# Patient Record
Sex: Female | Born: 1940 | Race: Black or African American | Hispanic: No | Smoking: Former smoker
Health system: Southern US, Community
[De-identification: ages and names within clinical notes are randomized; demographics above are authoritative.]

## PROBLEM LIST (undated history)

## (undated) ENCOUNTER — Ambulatory Visit (HOSPITAL_COMMUNITY): Discharge status: Absent without leave

## (undated) DIAGNOSIS — M5430 Sciatica, unspecified side: Secondary | ICD-10-CM

## (undated) DIAGNOSIS — F419 Anxiety disorder, unspecified: Secondary | ICD-10-CM

## (undated) DIAGNOSIS — I10 Essential (primary) hypertension: Secondary | ICD-10-CM

## (undated) DIAGNOSIS — K219 Gastro-esophageal reflux disease without esophagitis: Secondary | ICD-10-CM

## (undated) HISTORY — PX: OTHER SURGICAL HISTORY: SHX169

## (undated) HISTORY — PX: TUBAL LIGATION: SHX77

---

## 1998-08-19 ENCOUNTER — Emergency Department (HOSPITAL_COMMUNITY): Admission: EM | Admit: 1998-08-19 | Discharge: 1998-08-19 | Payer: Self-pay | Admitting: Internal Medicine

## 1999-03-25 ENCOUNTER — Encounter: Payer: Self-pay | Admitting: Emergency Medicine

## 1999-03-25 ENCOUNTER — Encounter: Payer: Self-pay | Admitting: Internal Medicine

## 1999-03-25 ENCOUNTER — Inpatient Hospital Stay (HOSPITAL_COMMUNITY): Admission: EM | Admit: 1999-03-25 | Discharge: 1999-03-28 | Payer: Self-pay | Admitting: Emergency Medicine

## 1999-03-26 ENCOUNTER — Encounter: Payer: Self-pay | Admitting: Internal Medicine

## 1999-03-27 ENCOUNTER — Encounter: Payer: Self-pay | Admitting: Internal Medicine

## 1999-05-08 ENCOUNTER — Ambulatory Visit (HOSPITAL_COMMUNITY): Admission: RE | Admit: 1999-05-08 | Discharge: 1999-05-08 | Payer: Self-pay | Admitting: Gastroenterology

## 1999-06-03 ENCOUNTER — Other Ambulatory Visit: Admission: RE | Admit: 1999-06-03 | Discharge: 1999-06-03 | Payer: Self-pay | Admitting: Internal Medicine

## 1999-08-05 ENCOUNTER — Encounter: Payer: Self-pay | Admitting: Internal Medicine

## 1999-08-05 ENCOUNTER — Encounter: Admission: RE | Admit: 1999-08-05 | Discharge: 1999-08-05 | Payer: Self-pay | Admitting: Internal Medicine

## 2000-11-24 ENCOUNTER — Other Ambulatory Visit: Admission: RE | Admit: 2000-11-24 | Discharge: 2000-11-24 | Payer: Self-pay | Admitting: Obstetrics and Gynecology

## 2001-01-18 ENCOUNTER — Encounter: Admission: RE | Admit: 2001-01-18 | Discharge: 2001-01-18 | Payer: Self-pay | Admitting: Cardiology

## 2001-01-18 ENCOUNTER — Encounter: Payer: Self-pay | Admitting: Cardiology

## 2001-06-01 ENCOUNTER — Encounter: Admission: RE | Admit: 2001-06-01 | Discharge: 2001-06-01 | Payer: Self-pay | Admitting: Cardiology

## 2001-06-01 ENCOUNTER — Encounter: Payer: Self-pay | Admitting: Cardiology

## 2001-09-22 ENCOUNTER — Ambulatory Visit (HOSPITAL_COMMUNITY): Admission: RE | Admit: 2001-09-22 | Discharge: 2001-09-22 | Payer: Self-pay | Admitting: Cardiology

## 2001-09-22 ENCOUNTER — Encounter: Payer: Self-pay | Admitting: Cardiology

## 2002-10-31 ENCOUNTER — Encounter: Payer: Self-pay | Admitting: Obstetrics

## 2002-10-31 ENCOUNTER — Ambulatory Visit (HOSPITAL_COMMUNITY): Admission: RE | Admit: 2002-10-31 | Discharge: 2002-10-31 | Payer: Self-pay | Admitting: Obstetrics

## 2003-03-04 ENCOUNTER — Encounter: Admission: RE | Admit: 2003-03-04 | Discharge: 2003-03-04 | Payer: Self-pay | Admitting: Internal Medicine

## 2003-04-08 ENCOUNTER — Emergency Department (HOSPITAL_COMMUNITY): Admission: EM | Admit: 2003-04-08 | Discharge: 2003-04-08 | Payer: Self-pay | Admitting: Emergency Medicine

## 2003-06-15 ENCOUNTER — Emergency Department (HOSPITAL_COMMUNITY): Admission: EM | Admit: 2003-06-15 | Discharge: 2003-06-15 | Payer: Self-pay

## 2003-09-04 ENCOUNTER — Ambulatory Visit (HOSPITAL_COMMUNITY): Admission: RE | Admit: 2003-09-04 | Discharge: 2003-09-04 | Payer: Self-pay | Admitting: Cardiology

## 2003-09-11 ENCOUNTER — Emergency Department (HOSPITAL_COMMUNITY): Admission: EM | Admit: 2003-09-11 | Discharge: 2003-09-12 | Payer: Self-pay | Admitting: *Deleted

## 2003-12-08 ENCOUNTER — Emergency Department (HOSPITAL_COMMUNITY): Admission: EM | Admit: 2003-12-08 | Discharge: 2003-12-08 | Payer: Self-pay | Admitting: Emergency Medicine

## 2004-05-21 ENCOUNTER — Ambulatory Visit (HOSPITAL_COMMUNITY): Admission: RE | Admit: 2004-05-21 | Discharge: 2004-05-21 | Payer: Self-pay | Admitting: *Deleted

## 2004-05-21 ENCOUNTER — Encounter (INDEPENDENT_AMBULATORY_CARE_PROVIDER_SITE_OTHER): Payer: Self-pay | Admitting: Specialist

## 2004-06-04 ENCOUNTER — Ambulatory Visit (HOSPITAL_COMMUNITY): Admission: RE | Admit: 2004-06-04 | Discharge: 2004-06-04 | Payer: Self-pay | Admitting: Obstetrics

## 2004-07-30 ENCOUNTER — Emergency Department (HOSPITAL_COMMUNITY): Admission: EM | Admit: 2004-07-30 | Discharge: 2004-07-30 | Payer: Self-pay | Admitting: Emergency Medicine

## 2004-11-27 ENCOUNTER — Encounter: Admission: RE | Admit: 2004-11-27 | Discharge: 2004-11-27 | Payer: Self-pay | Admitting: Cardiology

## 2005-05-25 ENCOUNTER — Emergency Department (HOSPITAL_COMMUNITY): Admission: EM | Admit: 2005-05-25 | Discharge: 2005-05-25 | Payer: Self-pay | Admitting: Emergency Medicine

## 2005-07-13 ENCOUNTER — Emergency Department (HOSPITAL_COMMUNITY): Admission: AD | Admit: 2005-07-13 | Discharge: 2005-07-13 | Payer: Self-pay | Admitting: Family Medicine

## 2005-07-15 ENCOUNTER — Emergency Department (HOSPITAL_COMMUNITY): Admission: EM | Admit: 2005-07-15 | Discharge: 2005-07-16 | Payer: Self-pay | Admitting: Emergency Medicine

## 2005-12-31 ENCOUNTER — Emergency Department (HOSPITAL_COMMUNITY): Admission: EM | Admit: 2005-12-31 | Discharge: 2005-12-31 | Payer: Self-pay | Admitting: Family Medicine

## 2006-04-12 ENCOUNTER — Emergency Department (HOSPITAL_COMMUNITY): Admission: EM | Admit: 2006-04-12 | Discharge: 2006-04-12 | Payer: Self-pay | Admitting: Family Medicine

## 2006-04-14 ENCOUNTER — Emergency Department (HOSPITAL_COMMUNITY): Admission: EM | Admit: 2006-04-14 | Discharge: 2006-04-14 | Payer: Self-pay | Admitting: Family Medicine

## 2006-06-24 ENCOUNTER — Emergency Department (HOSPITAL_COMMUNITY): Admission: EM | Admit: 2006-06-24 | Discharge: 2006-06-24 | Payer: Self-pay | Admitting: Emergency Medicine

## 2006-07-01 ENCOUNTER — Emergency Department (HOSPITAL_COMMUNITY): Admission: EM | Admit: 2006-07-01 | Discharge: 2006-07-01 | Payer: Self-pay | Admitting: Family Medicine

## 2006-08-20 ENCOUNTER — Encounter: Admission: RE | Admit: 2006-08-20 | Discharge: 2006-08-20 | Payer: Self-pay | Admitting: Obstetrics

## 2007-01-13 ENCOUNTER — Ambulatory Visit (HOSPITAL_BASED_OUTPATIENT_CLINIC_OR_DEPARTMENT_OTHER): Admission: RE | Admit: 2007-01-13 | Discharge: 2007-01-13 | Payer: Self-pay | Admitting: Orthopedic Surgery

## 2008-06-15 ENCOUNTER — Emergency Department (HOSPITAL_COMMUNITY): Admission: EM | Admit: 2008-06-15 | Discharge: 2008-06-15 | Payer: Self-pay | Admitting: Family Medicine

## 2008-12-27 ENCOUNTER — Emergency Department (HOSPITAL_COMMUNITY): Admission: EM | Admit: 2008-12-27 | Discharge: 2008-12-27 | Payer: Self-pay | Admitting: Family Medicine

## 2010-03-21 ENCOUNTER — Encounter
Admission: RE | Admit: 2010-03-21 | Discharge: 2010-03-21 | Payer: Self-pay | Source: Home / Self Care | Admitting: Cardiology

## 2010-05-05 ENCOUNTER — Encounter: Payer: Self-pay | Admitting: Internal Medicine

## 2010-05-05 ENCOUNTER — Encounter: Payer: Self-pay | Admitting: Obstetrics

## 2010-05-06 ENCOUNTER — Encounter: Payer: Self-pay | Admitting: Obstetrics

## 2010-08-27 NOTE — Op Note (Signed)
Darlene Curtis, Darlene Curtis           ACCOUNT NO.:  [id number]   MEDICAL RECORD NO.:  [id number]          PATIENT TYPE:  AMB   LOCATION:  DSC                          FACILITY:  [facility]   PHYSICIAN:  Mila Homer. Sherlean Foot, M.D. DATE OF BIRTH:  1940-05-06   DATE OF PROCEDURE:  01/13/2007  DATE OF DISCHARGE:                               OPERATIVE REPORT   SURGEON:  Mila Homer. Sherlean Foot, M.D.   ASSISTANT:  Richardean Canal, P.A.   ANESTHESIA:  General.   PREOPERATIVE DIAGNOSIS:  Right shoulder impingement syndrome with  possible rotator cuff tear.   POSTOPERATIVE DIAGNOSIS:  Right shoulder impingement syndrome with  possible rotator cuff tear.   PROCEDURES:  Right shoulder arthroscopy, glenohumeral debridement,  subacromial decompression, distal clavicle resection.   INDICATIONS FOR PROCEDURE:  The patient is a 70 year old black female  with failure of conservative measures for shoulder impingement syndrome,  severe tendinosis, possible small full-thickness tear.  Informed consent  obtained.   DESCRIPTION OF PROCEDURE:  The patient was laid supine and administered  general anesthesia.  The right shoulder was prepped and draped in the  usual sterile fashion.  Anterior and posterior direct lateral portals  were created with a #11 blade, blunt trocar and cannula.  Diagnostic  arthroscopy revealed some small areas of glenohumeral arthritis.  This  was debrided through the anterior portal a 3.2 Cuda shaver.  There was  some since degenerative tearing of labrum, which was debrided.  The  undersurface of the rotator cuff had wear and tear but no full-thickness  tear.  I then went into the subacromial space from the posterior portal  with the camera.  From the direct lateral portal I performed a CA  ligament release with the ArthroCare debridement wand.  I used the  shaver to clean out the bursa and used the ArthroCare debridement to  clean off the undersurface of the acromion and distal clavicle.  I  then  used a 4.0-mm cylindrical bur to perform an aggressive acromioplasty and  distal clavicle resection.  I then debrided the rotator cuff.  It looked  like there was some wear and tear directly beneath the anterolateral  corner but no full-thickness tears and certainly no avulsion tears.  I  then irrigated and closed for 4-0 nylon sutures, dressed with Xeroform  dressing sponges, ABDs, 2-inch silk tape and a simple sling.   COMPLICATIONS:  None.   DRAINS:  None.          ______________________________  Mila Homer. Sherlean Foot, M.D.    SDL/MEDQ  D:  01/13/2007  T:  01/14/2007  Job:  [id number]

## 2010-08-30 NOTE — Op Note (Signed)
NAMEMarland Curtis  KATTI, PELLE NO.:  [id number]   MEDICAL RECORD NO.:  [id number]          PATIENT TYPE:  AMB   LOCATION:  ENDO                         FACILITY:  [facility]   PHYSICIAN:  Georgiana Spinner, M.D.    DATE OF BIRTH:  1940-07-14   DATE OF PROCEDURE:  05/21/2004  DATE OF DISCHARGE:                                 OPERATIVE REPORT   PROCEDURE:  Upper endoscopy.   INDICATIONS FOR PROCEDURE:  Abdominal pain.   ANESTHESIA:  Demerol 60, Versed 8 mg.   DESCRIPTION OF PROCEDURE:  With the patient mildly sedated in the left  lateral decubitus position, the Olympus videoscopic endoscope was inserted  in the mouth and passed under direct vision through the esophagus which  appeared normal.  There was a hiatal hernia and a question of one short area  of Barrett's which was photographed and biopsied.  Entered into the stomach.  Fundus, body, antrum, duodenal bulb, and second portion of duodenum were  visualized.  From this point, the endoscope was slowly withdrawn taking  circumferential views of the duodenal mucosa.  The endoscope then pulled  back into the stomach, placed in retroflexion to view the stomach from  below.  The endoscope was then straightened and withdrawn taking  circumferential views of the gastric and esophageal mucosa.  The patient's  vital signs and pulse oximetry remained stable.  The patient tolerated the  procedure well without apparent complications.   FINDINGS:  1.  Question of Barrett's esophagus as above.  2.  Hiatal hernia, await biopsy report.   The patient will __________ follow up with me as an outpatient.      GMO/MEDQ  D:  05/21/2004  T:  05/21/2004  Job:  [id number]   cc:   Fleet Contras, M.D.  [address]  [place]  [place] [address]  Fax: [phone]

## 2010-08-30 NOTE — Procedures (Signed)
[place]  Patient:    Darlene Curtis                   MRN: [id number] Proc. Date: 05/08/99 Adm. Date:  [id number] Attending:  Charna Elizabeth CC:         Merlene Laughter. Renae Gloss, M.D.                           Procedure Report  DATE OF BIRTH:  1941-01-12  REFERRING PHYSICIAN:  Merlene Laughter. Renae Gloss, M.D.  PROCEDURE PERFORMED:  Colonoscopy.  ENDOSCOPIST:  Anselmo Rod, M.D.  INSTRUMENT USED:  Olympus video colonoscope.  INDICATIONS:  A 70 year old black female with guaiac positive stools.  Rule out  polyps, AVMs, masses, hemorrhoids, etc.  PREPROCEDURE PREPARATION:  Informed consent was procured from the patient.  The  patient was fasted for 8 hours prior to the procedure and prepped with a bottle of magnesium citrate and a gallon NuLytely the night prior to the procedure.  PREPROCEDURE PHYSICAL:  Patient has stable vital signs.  NECK:  Supple.  CHEST:  Clear to auscultation. S1, S2 regular.  ABDOMEN:  Soft with normal abdominal bowel sounds.  DESCRIPTION OF PROCEDURE:  The patient was placed in left lateral decubitus position and sedated with 75 mg Demerol and 7 mg of Versed intravenously.  Once the patient was adequately sedated and maintained on low-flow oxygen and continuous  cardiac monitoring, the Olympus video colonoscope was advanced from the rectum o the cecum without difficulty.  Except for small external hemorrhoid, no other abnormalities were seen.  No erosions, ulcerations, masses or diverticular disease was present.  The patient tolerated the procedure well without complication.  IMPRESSION:  Normal colonoscopy up to the cecum, except for a small external hemorrhoid.  RECOMMENDATIONS:  Patient has been advised to follow up in the office to repeat  guaiac testing.  Further recommendations will be made thereafter. DD:  05/08/99 TD:  05/08/99 Job: [id number]

## 2010-12-26 ENCOUNTER — Inpatient Hospital Stay (INDEPENDENT_AMBULATORY_CARE_PROVIDER_SITE_OTHER)
Admission: RE | Admit: 2010-12-26 | Discharge: 2010-12-26 | Disposition: A | Discharge status: Home / Self Care | Payer: Self-pay | Source: Ambulatory Visit | Attending: Family Medicine | Admitting: Family Medicine

## 2010-12-26 DIAGNOSIS — J019 Acute sinusitis, unspecified: Secondary | ICD-10-CM

## 2011-01-23 LAB — POCT HEMOGLOBIN-HEMACUE: Hemoglobin: 13.1

## 2011-06-11 ENCOUNTER — Emergency Department (INDEPENDENT_AMBULATORY_CARE_PROVIDER_SITE_OTHER)
Admission: EM | Admit: 2011-06-11 | Discharge: 2011-06-11 | Disposition: A | Discharge status: Home / Self Care | Payer: Medicare Other | Source: Home / Self Care | Attending: Emergency Medicine | Admitting: Emergency Medicine

## 2011-06-11 ENCOUNTER — Encounter (HOSPITAL_COMMUNITY): Payer: Self-pay | Admitting: *Deleted

## 2011-06-11 DIAGNOSIS — J309 Allergic rhinitis, unspecified: Secondary | ICD-10-CM

## 2011-06-11 DIAGNOSIS — R51 Headache: Secondary | ICD-10-CM

## 2011-06-11 MED ORDER — AZITHROMYCIN 250 MG PO TABS: ORAL_TABLET | ORAL | Status: AC

## 2011-06-11 MED ORDER — FEXOFENADINE HCL 180 MG PO TABS: 180.0000 mg | ORAL_TABLET | Freq: Every day | ORAL | Status: DC

## 2011-06-11 MED ORDER — PREDNISONE 5 MG PO KIT: 1.0000 | PACK | Freq: Every day | ORAL | Status: DC

## 2011-06-11 MED ORDER — FLUTICASONE PROPIONATE 50 MCG/ACT NA SUSP: 2.0000 | Freq: Every day | NASAL | Status: DC

## 2011-06-11 NOTE — Discharge Instructions (Signed)
Allergic Rhinitis Allergic rhinitis is when the mucous membranes in the nose respond to allergens. Allergens are particles in the air that cause your body to have an allergic reaction. This causes you to release allergic antibodies. Through a chain of events, these eventually cause you to release histamine into the blood stream (hence the use of antihistamines). Although meant to be protective to the body, it is this release that causes your discomfort, such as frequent sneezing, congestion and an itchy runny nose.  CAUSES  The pollen allergens may come from grasses, trees, and weeds. This is seasonal allergic rhinitis, or "hay fever." Other allergens cause year-round allergic rhinitis (perennial allergic rhinitis) such as house dust mite allergen, pet dander and mold spores.  SYMPTOMS   Nasal stuffiness (congestion).   Runny, itchy nose with sneezing and tearing of the eyes.   There is often an itching of the mouth, eyes and ears.  It cannot be cured, but it can be controlled with medications. DIAGNOSIS  If you are unable to determine the offending allergen, skin or blood testing may find it. TREATMENT   Avoid the allergen.   Medications and allergy shots (immunotherapy) can help.   Hay fever may often be treated with antihistamines in pill or nasal spray forms. Antihistamines block the effects of histamine. There are over-the-counter medicines that may help with nasal congestion and swelling around the eyes. Check with your caregiver before taking or giving this medicine.  If the treatment above does not work, there are many new medications your caregiver can prescribe. Stronger medications may be used if initial measures are ineffective. Desensitizing injections can be used if medications and avoidance fails. Desensitization is when a patient is given ongoing shots until the body becomes less sensitive to the allergen. Make sure you follow up with your caregiver if problems continue. SEEK  MEDICAL CARE IF:   You develop fever (more than 100.5 F (38.1 C).   You develop a cough that does not stop easily (persistent).   You have shortness of breath.   You start wheezing.   Symptoms interfere with normal daily activities.  Document Released: 12/24/2000 Document Revised: 12/11/2010 Document Reviewed: 07/05/2008 [facility] Patient Information 2012 [place], [place].Headaches, Frequently Asked Questions MIGRAINE HEADACHES Q: What is migraine? What causes it? How can I treat it? A: Generally, migraine headaches begin as a dull ache. Then they develop into a constant, throbbing, and pulsating pain. You may experience pain at the temples. You may experience pain at the front or back of one or both sides of the head. The pain is usually accompanied by a combination of:  Nausea.   Vomiting.   Sensitivity to light and noise.  Some people (about 15%) experience an aura (see below) before an attack. The cause of migraine is believed to be chemical reactions in the brain. Treatment for migraine may include over-the-counter or prescription medications. It may also include self-help techniques. These include relaxation training and biofeedback.  Q: What is an aura? A: About 15% of people with migraine get an "aura". This is a sign of neurological symptoms that occur before a migraine headache. You may see wavy or jagged lines, dots, or flashing lights. You might experience tunnel vision or blind spots in one or both eyes. The aura can include visual or auditory hallucinations (something imagined). It may include disruptions in smell (such as strange odors), taste or touch. Other symptoms include:  Numbness.   A "pins and needles" sensation.   Difficulty in recalling  or speaking the correct word.  These neurological events may last as long as 60 minutes. These symptoms will fade as the headache begins. Q: What is a trigger? A: Certain physical or environmental factors can lead to or  "trigger" a migraine. These include:  Foods.   Hormonal changes.   Weather.   Stress.  It is important to remember that triggers are different for everyone. To help prevent migraine attacks, you need to figure out which triggers affect you. Keep a headache diary. This is a good way to track triggers. The diary will help you talk to your healthcare professional about your condition. Q: Does weather affect migraines? A: Bright sunshine, hot, humid conditions, and drastic changes in barometric pressure may lead to, or "trigger," a migraine attack in some people. But studies have shown that weather does not act as a trigger for everyone with migraines. Q: What is the link between migraine and hormones? A: Hormones start and regulate many of your body's functions. Hormones keep your body in balance within a constantly changing environment. The levels of hormones in your body are unbalanced at times. Examples are during menstruation, pregnancy, or menopause. That can lead to a migraine attack. In fact, about three quarters of all women with migraine report that their attacks are related to the menstrual cycle.  Q: Is there an increased risk of stroke for migraine sufferers? A: The likelihood of a migraine attack causing a stroke is very remote. That is not to say that migraine sufferers cannot have a stroke associated with their migraines. In persons under age 74, the most common associated factor for stroke is migraine headache. But over the course of a person's normal life span, the occurrence of migraine headache may actually be associated with a reduced risk of dying from cerebrovascular disease due to stroke.  Q: What are acute medications for migraine? A: Acute medications are used to treat the pain of the headache after it has started. Examples over-the-counter medications, NSAIDs, ergots, and triptans.  Q: What are the triptans? A: Triptans are the newest class of abortive medications. They are  specifically targeted to treat migraine. Triptans are vasoconstrictors. They moderate some chemical reactions in the brain. The triptans work on receptors in your brain. Triptans help to restore the balance of a neurotransmitter called serotonin. Fluctuations in levels of serotonin are thought to be a main cause of migraine.  Q: Are over-the-counter medications for migraine effective? A: Over-the-counter, or "OTC," medications may be effective in relieving mild to moderate pain and associated symptoms of migraine. But you should see your caregiver before beginning any treatment regimen for migraine.  Q: What are preventive medications for migraine? A: Preventive medications for migraine are sometimes referred to as "prophylactic" treatments. They are used to reduce the frequency, severity, and length of migraine attacks. Examples of preventive medications include antiepileptic medications, antidepressants, beta-blockers, calcium channel blockers, and NSAIDs (nonsteroidal anti-inflammatory drugs). Q: Why are anticonvulsants used to treat migraine? A: During the past few years, there has been an increased interest in antiepileptic drugs for the prevention of migraine. They are sometimes referred to as "anticonvulsants". Both epilepsy and migraine may be caused by similar reactions in the brain.  Q: Why are antidepressants used to treat migraine? A: Antidepressants are typically used to treat people with depression. They may reduce migraine frequency by regulating chemical levels, such as serotonin, in the brain.  Q: What alternative therapies are used to treat migraine? A: The term "alternative therapies" is  often used to describe treatments considered outside the scope of conventional Western medicine. Examples of alternative therapy include acupuncture, acupressure, and yoga. Another common alternative treatment is herbal therapy. Some herbs are believed to relieve headache pain. Always discuss alternative  therapies with your caregiver before proceeding. Some herbal products contain arsenic and other toxins. TENSION HEADACHES Q: What is a tension-type headache? What causes it? How can I treat it? A: Tension-type headaches occur randomly. They are often the result of temporary stress, anxiety, fatigue, or anger. Symptoms include soreness in your temples, a tightening band-like sensation around your head (a "vice-like" ache). Symptoms can also include a pulling feeling, pressure sensations, and contracting head and neck muscles. The headache begins in your forehead, temples, or the back of your head and neck. Treatment for tension-type headache may include over-the-counter or prescription medications. Treatment may also include self-help techniques such as relaxation training and biofeedback. CLUSTER HEADACHES Q: What is a cluster headache? What causes it? How can I treat it? A: Cluster headache gets its name because the attacks come in groups. The pain arrives with little, if any, warning. It is usually on one side of the head. A tearing or bloodshot eye and a runny nose on the same side of the headache may also accompany the pain. Cluster headaches are believed to be caused by chemical reactions in the brain. They have been described as the most severe and intense of any headache type. Treatment for cluster headache includes prescription medication and oxygen. SINUS HEADACHES Q: What is a sinus headache? What causes it? How can I treat it? A: When a cavity in the bones of the face and skull (a sinus) becomes inflamed, the inflammation will cause localized pain. This condition is usually the result of an allergic reaction, a tumor, or an infection. If your headache is caused by a sinus blockage, such as an infection, you will probably have a fever. An x-ray will confirm a sinus blockage. Your caregiver's treatment might include antibiotics for the infection, as well as antihistamines or decongestants.    REBOUND HEADACHES Q: What is a rebound headache? What causes it? How can I treat it? A: A pattern of taking acute headache medications too often can lead to a condition known as "rebound headache." A pattern of taking too much headache medication includes taking it more than 2 days per week or in excessive amounts. That means more than the label or a caregiver advises. With rebound headaches, your medications not only stop relieving pain, they actually begin to cause headaches. Doctors treat rebound headache by tapering the medication that is being overused. Sometimes your caregiver will gradually substitute a different type of treatment or medication. Stopping may be a challenge. Regularly overusing a medication increases the potential for serious side effects. Consult a caregiver if you regularly use headache medications more than 2 days per week or more than the label advises. ADDITIONAL QUESTIONS AND ANSWERS Q: What is biofeedback? A: Biofeedback is a self-help treatment. Biofeedback uses special equipment to monitor your body's involuntary physical responses. Biofeedback monitors:  Breathing.   Pulse.   Heart rate.   Temperature.   Muscle tension.   Brain activity.  Biofeedback helps you refine and perfect your relaxation exercises. You learn to control the physical responses that are related to stress. Once the technique has been mastered, you do not need the equipment any more. Q: Are headaches hereditary? A: Four out of five (80%) of people that suffer report a family history  of migraine. Scientists are not sure if this is genetic or a family predisposition. Despite the uncertainty, a child has a 50% chance of having migraine if one parent suffers. The child has a 75% chance if both parents suffer.  Q: Can children get headaches? A: By the time they reach high school, most young people have experienced some type of headache. Many safe and effective approaches or medications can  prevent a headache from occurring or stop it after it has begun.  Q: What type of doctor should I see to diagnose and treat my headache? A: Start with your primary caregiver. Discuss his or her experience and approach to headaches. Discuss methods of classification, diagnosis, and treatment. Your caregiver may decide to recommend you to a headache specialist, depending upon your symptoms or other physical conditions. Having diabetes, allergies, etc., may require a more comprehensive and inclusive approach to your headache. The National Head[employer] Foundation will provide, upon request, a list of [facility] physician members in your state. Document Released: 06/21/2003 Document Revised: 12/11/2010 Document Reviewed: 11/29/2007 [facility] Patient Information 2012 [place], [place].

## 2011-06-11 NOTE — ED Provider Notes (Signed)
Chief Complaint  Patient presents with  . Headache    History of Present Illness:  Darlene Curtis is a 71 year old female who has had a day history of a bifrontal pressure-like headache which she feels in the back of her eyes. She denies any associated nausea, photophobia, or phonophobia. She's had no visual symptoms including diplopia, blurred vision, auras, or spots. She does have some nasal congestion and rhinorrhea which is occasionally yellow in color. She denies any fevers or chills, she has had some sweats at nighttime. No sore throat or cough. She does have a history of sinus headache in the past. She's been trying Sudafed without relief. She denies any jaw claudication. She's had no paresthesias, numbness, tingling, muscle weakness, difficulty with speech, swallowing, or ambulation.  Review of Systems:  Other than noted above, the patient denies any of the following symptoms: Systemic:  No fever, chills, fatigue, photophobia, stiff neck. Eye:  No redness, eye pain, discharge, blurred vision, or diplopia. ENT:  No nasal congestion, rhinorrhea, sinus pressure or pain, sneezing, earache, or sore throat. Neuro:  No paresthesias, loss of consciousness, seizure activity, muscle weakness, trouble with coordination or gait, trouble speaking or swallowing. Psych:  No depression, anxiety or trouble sleeping.  PMFSH:  Past medical history, family history, social history, meds, and allergies were reviewed.  Physical Exam:   Vital signs:  BP 154/93  Pulse 93  Temp(Src) 97.8 F (36.6 C) (Oral)  Resp 18  SpO2 100% General:  Alert and oriented.  In no distress. Eye:  Lids and conjunctivas normal.  PERRL,  Full EOMs.  Fundi benign with normal discs and vessels. She has had bilateral cataract extractions. ENT:  No cranial or facial tenderness to palpation.  TMs and canals clear.  Nasal mucosa was congested without any drainage. No intra oral lesions, pharynx clear, mucous membranes moist, dentition  normal. Neck:  Supple, full ROM, no tenderness to palpation.  No adenopathy or mass. Neuro:  Alert and orented times 3.  Speech was clear, fluent, and appropriate.  Cranial nerves intact. No pronator drift, muscle strength normal. Finger to nose normal.  DTRs 2+ . Psych:  Normal affect.  Assessment:   Diagnoses that have been ruled out:  None  Diagnoses that are still under consideration:  None  Final diagnoses:  Sinus headache  Allergic rhinitis    Plan:   1.  The following meds were prescribed:   New Prescriptions   AZITHROMYCIN (ZITHROMAX Z-PAK) 250 MG TABLET    Take as directed.   FEXOFENADINE (ALLEGRA) 180 MG TABLET    Take 1 tablet (180 mg total) by mouth daily.   FLUTICASONE (FLONASE) 50 MCG/ACT NASAL SPRAY    Place 2 sprays into the nose daily.   PREDNISONE 5 MG KIT    Take 1 kit (5 mg total) by mouth daily after breakfast. Prednisone 5 mg 6 day dosepack.  Take as directed.   2.  The patient was instructed in symptomatic care and handouts were given. 3.  The patient was told to return if becoming worse in any way, if no better in 3 or 4 days, and given some red flag symptoms that would indicate earlier return.    Roque Lias, MD 06/11/11 [phone]

## 2011-06-11 NOTE — ED Notes (Signed)
pT  REPORTS  SYMPTOMS  OF  SINUS  CONGESTION   /  HEADACHES     FOR THREE  DAYS      SHE  REPORTS  THE  SYMPTOMS  UNRELEIVED  BY OTC  MEDS    SUCH AS  SUDAFED       SHE  IS  AWAKE   AND  ALERT     ORIENTED         SITTING  UP  COMFORTABLY ON  EXAM TABLE  SPEAKING IN  COMPLETE  SENTANCES

## 2012-03-17 ENCOUNTER — Other Ambulatory Visit: Payer: Self-pay | Admitting: Internal Medicine

## 2012-03-17 DIAGNOSIS — R1011 Right upper quadrant pain: Secondary | ICD-10-CM

## 2012-03-19 ENCOUNTER — Ambulatory Visit
Admission: RE | Admit: 2012-03-19 | Discharge: 2012-03-19 | Disposition: A | Discharge status: Home / Self Care | Payer: Medicare Other | Source: Ambulatory Visit | Attending: Internal Medicine | Admitting: Internal Medicine

## 2012-03-19 DIAGNOSIS — R1011 Right upper quadrant pain: Secondary | ICD-10-CM

## 2012-03-19 MED ORDER — IOHEXOL 300 MG/ML  SOLN
100.0000 mL | Freq: Once | INTRAMUSCULAR | Status: AC | PRN
  Administered 2012-03-19: 100 mL via INTRAVENOUS

## 2012-05-25 ENCOUNTER — Emergency Department (HOSPITAL_COMMUNITY)
Admission: EM | Admit: 2012-05-25 | Discharge: 2012-05-25 | Disposition: A | Discharge status: Home / Self Care | Payer: Medicare Other | Attending: Emergency Medicine | Admitting: Emergency Medicine

## 2012-05-25 ENCOUNTER — Encounter (HOSPITAL_COMMUNITY): Payer: Self-pay | Admitting: Emergency Medicine

## 2012-05-25 ENCOUNTER — Emergency Department (HOSPITAL_COMMUNITY): Payer: Medicare Other

## 2012-05-25 DIAGNOSIS — K219 Gastro-esophageal reflux disease without esophagitis: Secondary | ICD-10-CM | POA: Insufficient documentation

## 2012-05-25 DIAGNOSIS — N390 Urinary tract infection, site not specified: Secondary | ICD-10-CM | POA: Insufficient documentation

## 2012-05-25 DIAGNOSIS — Z87891 Personal history of nicotine dependence: Secondary | ICD-10-CM | POA: Insufficient documentation

## 2012-05-25 DIAGNOSIS — Z79899 Other long term (current) drug therapy: Secondary | ICD-10-CM | POA: Insufficient documentation

## 2012-05-25 DIAGNOSIS — F411 Generalized anxiety disorder: Secondary | ICD-10-CM | POA: Insufficient documentation

## 2012-05-25 DIAGNOSIS — R110 Nausea: Secondary | ICD-10-CM | POA: Insufficient documentation

## 2012-05-25 HISTORY — DX: Gastro-esophageal reflux disease without esophagitis: K21.9

## 2012-05-25 HISTORY — DX: Anxiety disorder, unspecified: F41.9

## 2012-05-25 LAB — CBC WITH DIFFERENTIAL/PLATELET
Basophils Absolute: 0 10*3/uL (ref 0.0–0.1)
Basophils Relative: 0 % (ref 0–1)
Eosinophils Absolute: 0.3 10*3/uL (ref 0.0–0.7)
Eosinophils Relative: 5 % (ref 0–5)
HCT: 41.5 % (ref 36.0–46.0)
Hemoglobin: 13.7 g/dL (ref 12.0–15.0)
Lymphocytes Relative: 35 % (ref 12–46)
Lymphs Abs: 2 10*3/uL (ref 0.7–4.0)
MCH: 28.7 pg (ref 26.0–34.0)
MCHC: 33 g/dL (ref 30.0–36.0)
MCV: 87 fL (ref 78.0–100.0)
Monocytes Absolute: 0.4 10*3/uL (ref 0.1–1.0)
Monocytes Relative: 7 % (ref 3–12)
Neutro Abs: 3.2 10*3/uL (ref 1.7–7.7)
Neutrophils Relative %: 54 % (ref 43–77)
Platelets: 271 10*3/uL (ref 150–400)
RBC: 4.77 MIL/uL (ref 3.87–5.11)
RDW: 13.5 % (ref 11.5–15.5)
WBC: 5.8 10*3/uL (ref 4.0–10.5)

## 2012-05-25 LAB — URINALYSIS, ROUTINE W REFLEX MICROSCOPIC
Bilirubin Urine: NEGATIVE
Glucose, UA: NEGATIVE mg/dL
Hgb urine dipstick: NEGATIVE
Ketones, ur: NEGATIVE mg/dL
Nitrite: POSITIVE — AB
Protein, ur: NEGATIVE mg/dL
Specific Gravity, Urine: 1.006 (ref 1.005–1.030)
Urobilinogen, UA: 0.2 mg/dL (ref 0.0–1.0)
pH: 6.5 (ref 5.0–8.0)

## 2012-05-25 LAB — COMPREHENSIVE METABOLIC PANEL
ALT: 11 U/L (ref 0–35)
AST: 16 U/L (ref 0–37)
Albumin: 3.8 g/dL (ref 3.5–5.2)
Alkaline Phosphatase: 110 U/L (ref 39–117)
BUN: 10 mg/dL (ref 6–23)
CO2: 30 mEq/L (ref 19–32)
Calcium: 9.8 mg/dL (ref 8.4–10.5)
Chloride: 103 mEq/L (ref 96–112)
Creatinine, Ser: 1.02 mg/dL (ref 0.50–1.10)
GFR calc Af Amer: 63 mL/min — ABNORMAL LOW (ref >90)
GFR calc non Af Amer: 54 mL/min — ABNORMAL LOW (ref >90)
Glucose, Bld: 88 mg/dL (ref 70–99)
Potassium: 3.6 mEq/L (ref 3.5–5.1)
Sodium: 142 mEq/L (ref 135–145)
Total Bilirubin: 0.3 mg/dL (ref 0.3–1.2)
Total Protein: 7.1 g/dL (ref 6.0–8.3)

## 2012-05-25 LAB — URINE MICROSCOPIC-ADD ON

## 2012-05-25 LAB — LIPASE, BLOOD: Lipase: 34 U/L (ref 11–59)

## 2012-05-25 LAB — LACTIC ACID, PLASMA: Lactic Acid, Venous: 0.9 mmol/L (ref 0.5–2.2)

## 2012-05-25 MED ORDER — PROMETHAZINE HCL 25 MG PO TABS: 25.0000 mg | ORAL_TABLET | Freq: Four times a day (QID) | ORAL | Status: DC | PRN

## 2012-05-25 MED ORDER — NITROFURANTOIN MONOHYD MACRO 100 MG PO CAPS: 100.0000 mg | ORAL_CAPSULE | Freq: Two times a day (BID) | ORAL | Status: DC

## 2012-05-25 NOTE — ED Provider Notes (Signed)
History     CSN: [id number]  Arrival date & time 05/25/12  0547   First MD Initiated Contact with Patient 05/25/12 [phone]      Chief Complaint  Patient presents with  . Abdominal Pain    epigastric    (Consider location/radiation/quality/duration/timing/severity/associated sxs/prior treatment) Patient is a 72 y.o. female presenting with abdominal pain.  Abdominal Pain  Patient is a 72 yo female with a history of GERD and anxiety who presents today with abdominal pain.  She started to feel nauseated since Saturday evening but it got worse on Sunday.  Also on Sunday she started to have a burning sensation in the epigastric region that is constant.  She though maybe this was related to her GERD and so she took Nexium but that didn't work.  After not having a bowel movement the day before she decided to take a laxative.  Her bowel movement didn't have any blood in it but she noticed a thick jelly like substance this morning.  Today she also threw a little bit of clear fluid.  Denies any fever, shortness of breath, chest pain, dysuria, hematuria, triggers, alleviating factors, previous burning pain like this, smoking or alcohol intake.    Past Medical History  Diagnosis Date  . Anxiety   . GERD (gastroesophageal reflux disease)     Past Surgical History  Procedure Laterality Date  . R shoulder surgery      No family history on file.  History  Substance Use Topics  . Smoking status: Former Games developer  . Smokeless tobacco: Not on file  . Alcohol Use: No    OB History   Grav Para Term Preterm Abortions TAB SAB Ect Mult Living                  Review of Systems  Gastrointestinal: Positive for abdominal pain.   All other systems negative except as documented in the HPI. All pertinent positives and negatives as reviewed in the HPI.  Allergies  Amoxicillin  Home Medications   Current Outpatient Rx  Name  Route  Sig  Dispense  Refill  . esomeprazole (NEXIUM) 40 MG capsule  Oral   Take 40 mg by mouth daily before breakfast.         . HYDROcodone-acetaminophen (NORCO/VICODIN) 5-325 MG per tablet   Oral   Take 0.5-1 tablets by mouth every 6 (six) hours as needed for pain. pain         . ALPRAZolam (XANAX) 0.5 MG tablet   Oral   Take 0.5 mg by mouth at bedtime as needed.         . fluticasone (FLONASE) 50 MCG/ACT nasal spray   Nasal   Place 2 sprays into the nose daily.   16 g   0     BP 161/94  Pulse 98  Temp(Src) 98 F (36.7 C) (Oral)  Ht 5\' 2"  (1.575 m)  Wt 150 lb (68.04 kg)  BMI 27.43 kg/m2  SpO2 98%  Physical Exam  Constitutional: She is oriented to person, place, and time. She appears well-developed and well-nourished.  HENT:  Head: Normocephalic and atraumatic.  Nose: Nose normal.  Eyes: Conjunctivae, EOM and lids are normal. Pupils are equal, round, and reactive to light.  Neck: Normal range of motion.  Cardiovascular: Normal rate, regular rhythm and normal heart sounds.   Pulmonary/Chest: Effort normal and breath sounds normal. She has no decreased breath sounds. She has no wheezes. She has no rhonchi. She has no rales.  Abdominal: Soft. Normal appearance. There is tenderness in the epigastric area. There is no rigidity, no rebound and no guarding.    Neurological: She is alert and oriented to person, place, and time. No cranial nerve deficit or sensory deficit.  Skin: No rash noted. She is not diaphoretic. No pallor.  Psychiatric: She has a normal mood and affect. Her speech is normal and behavior is normal. Judgment and thought content normal. Cognition and memory are normal.    ED Course  Procedures (including critical care time)  Labs Reviewed  COMPREHENSIVE METABOLIC PANEL  CBC WITH DIFFERENTIAL  URINALYSIS, ROUTINE W REFLEX MICROSCOPIC  LIPASE, BLOOD   The patient retreated for UTI and she has had frequency with urination after further questioning.  Patient states that she's not had any fevers.  The patient has not  had any vomiting here in the emergency department has been stable.  Patient, states that she will followup with her primary care doctor.  She is advised to return here for any worsening in her condition.   MDM  MDM Reviewed: nursing note and vitals Interpretation: labs and ultrasound            Carlyle Dolly, PA-C 05/26/12 1516

## 2012-05-25 NOTE — ED Notes (Signed)
Per lab, tube system problems earlier and never received patient urine sample.  Will resample when she returns from [place].

## 2012-05-25 NOTE — ED Notes (Signed)
Patient claims that she started having abdominal pain Sunday night.  Patient claims that she took a laxative Monday morning and felt relief.  But that the pain came back.  Patient claims that she did have one episode of vomiting.

## 2012-05-27 LAB — URINE CULTURE

## 2012-05-28 NOTE — ED Notes (Signed)
+   Urine] Patient treated with Macrobid-sensitive to same-chart appended per protocol MD. 

## 2012-05-28 NOTE — ED Provider Notes (Signed)
Medical screening examination/treatment/procedure(s) were performed by non-physician practitioner and as supervising physician I was immediately available for consultation/collaboration.  Jerrianne Hartin K Linker, MD 05/28/12 1502 

## 2012-09-13 ENCOUNTER — Other Ambulatory Visit: Payer: Self-pay

## 2012-09-13 ENCOUNTER — Other Ambulatory Visit: Payer: Self-pay | Admitting: Internal Medicine

## 2012-09-13 DIAGNOSIS — Z1231 Encounter for screening mammogram for malignant neoplasm of breast: Secondary | ICD-10-CM

## 2012-09-13 DIAGNOSIS — Z780 Asymptomatic menopausal state: Secondary | ICD-10-CM

## 2012-10-12 ENCOUNTER — Ambulatory Visit
Admission: RE | Admit: 2012-10-12 | Discharge: 2012-10-12 | Disposition: A | Discharge status: Home / Self Care | Payer: Medicare Other | Source: Ambulatory Visit

## 2012-10-12 ENCOUNTER — Ambulatory Visit
Admission: RE | Admit: 2012-10-12 | Discharge: 2012-10-12 | Disposition: A | Discharge status: Home / Self Care | Payer: Medicare Other | Source: Ambulatory Visit | Attending: Internal Medicine | Admitting: Internal Medicine

## 2012-10-12 DIAGNOSIS — Z780 Asymptomatic menopausal state: Secondary | ICD-10-CM

## 2012-10-12 DIAGNOSIS — Z1231 Encounter for screening mammogram for malignant neoplasm of breast: Secondary | ICD-10-CM

## 2012-11-12 ENCOUNTER — Inpatient Hospital Stay (HOSPITAL_COMMUNITY)
Admission: EM | Admit: 2012-11-12 | Discharge: 2012-11-17 | DRG: 392 | Disposition: A | Discharge status: Home / Self Care | Payer: Medicare Other | Attending: Surgery | Admitting: Surgery

## 2012-11-12 ENCOUNTER — Emergency Department (HOSPITAL_COMMUNITY): Payer: Medicare Other

## 2012-11-12 ENCOUNTER — Encounter (HOSPITAL_COMMUNITY): Payer: Self-pay | Admitting: Emergency Medicine

## 2012-11-12 DIAGNOSIS — K5289 Other specified noninfective gastroenteritis and colitis: Principal | ICD-10-CM | POA: Diagnosis present

## 2012-11-12 DIAGNOSIS — Z87891 Personal history of nicotine dependence: Secondary | ICD-10-CM

## 2012-11-12 DIAGNOSIS — E875 Hyperkalemia: Secondary | ICD-10-CM | POA: Diagnosis present

## 2012-11-12 DIAGNOSIS — K56600 Partial intestinal obstruction, unspecified as to cause: Secondary | ICD-10-CM | POA: Diagnosis present

## 2012-11-12 DIAGNOSIS — K219 Gastro-esophageal reflux disease without esophagitis: Secondary | ICD-10-CM | POA: Diagnosis present

## 2012-11-12 DIAGNOSIS — K565 Intestinal adhesions [bands], unspecified as to partial versus complete obstruction: Secondary | ICD-10-CM | POA: Diagnosis present

## 2012-11-12 DIAGNOSIS — K56609 Unspecified intestinal obstruction, unspecified as to partial versus complete obstruction: Secondary | ICD-10-CM

## 2012-11-12 DIAGNOSIS — F411 Generalized anxiety disorder: Secondary | ICD-10-CM | POA: Diagnosis present

## 2012-11-12 LAB — URINE MICROSCOPIC-ADD ON

## 2012-11-12 LAB — CBC WITH DIFFERENTIAL/PLATELET
Eosinophils Absolute: 0.1 10*3/uL (ref 0.0–0.7)
Eosinophils Relative: 1 % (ref 0–5)
Lymphs Abs: 1.3 10*3/uL (ref 0.7–4.0)
MCH: 29.6 pg (ref 26.0–34.0)
MCHC: 34.5 g/dL (ref 30.0–36.0)
MCV: 85.8 fL (ref 78.0–100.0)
Platelets: 277 10*3/uL (ref 150–400)
RBC: 5 MIL/uL (ref 3.87–5.11)
RDW: 13.6 % (ref 11.5–15.5)

## 2012-11-12 LAB — LIPASE, BLOOD: Lipase: 26 U/L (ref 11–59)

## 2012-11-12 LAB — URINALYSIS, ROUTINE W REFLEX MICROSCOPIC
Nitrite: NEGATIVE
Specific Gravity, Urine: 1.009 (ref 1.005–1.030)
Urobilinogen, UA: 0.2 mg/dL (ref 0.0–1.0)

## 2012-11-12 LAB — COMPREHENSIVE METABOLIC PANEL
ALT: 19 U/L (ref 0–35)
BUN: 6 mg/dL (ref 6–23)
Calcium: 9.6 mg/dL (ref 8.4–10.5)
GFR calc Af Amer: 83 mL/min — ABNORMAL LOW (ref >90)
Glucose, Bld: 97 mg/dL (ref 70–99)
Sodium: 135 mEq/L (ref 135–145)
Total Protein: 8.2 g/dL (ref 6.0–8.3)

## 2012-11-12 MED ORDER — MORPHINE SULFATE 2 MG/ML IJ SOLN
2.0000 mg | Freq: Once | INTRAMUSCULAR | Status: AC
  Administered 2012-11-12: 2 mg via INTRAVENOUS
  Filled 2012-11-12: qty 1

## 2012-11-12 MED ORDER — SODIUM CHLORIDE 0.9 % IV BOLUS (SEPSIS)
1000.0000 mL | Freq: Once | INTRAVENOUS | Status: AC
  Administered 2012-11-12: 1000 mL via INTRAVENOUS

## 2012-11-12 MED ORDER — MORPHINE SULFATE 4 MG/ML IJ SOLN
4.0000 mg | Freq: Once | INTRAMUSCULAR | Status: DC
  Filled 2012-11-12: qty 1

## 2012-11-12 MED ORDER — ONDANSETRON HCL 4 MG/2ML IJ SOLN
4.0000 mg | Freq: Once | INTRAMUSCULAR | Status: AC
  Administered 2012-11-12: 4 mg via INTRAVENOUS
  Filled 2012-11-12: qty 2

## 2012-11-12 MED ORDER — IOHEXOL 300 MG/ML  SOLN
50.0000 mL | Freq: Once | INTRAMUSCULAR | Status: AC | PRN
  Administered 2012-11-12: 50 mL via ORAL

## 2012-11-12 MED ORDER — IOHEXOL 300 MG/ML  SOLN
100.0000 mL | Freq: Once | INTRAMUSCULAR | Status: AC | PRN
  Administered 2012-11-12: 100 mL via INTRAVENOUS

## 2012-11-12 NOTE — ED Provider Notes (Signed)
CSN: [id number]     Arrival date & time 11/12/12  1723 History     First MD Initiated Contact with Patient 11/12/12 1840     Chief Complaint  Patient presents with  . Abdominal Pain   (Consider location/radiation/quality/duration/timing/severity/associated sxs/prior Treatment) Patient is a 72 y.o. female presenting with abdominal pain. The history is provided by the patient.  Abdominal Pain This is a new problem. Episode onset: this morning. The problem occurs constantly. The problem has been gradually worsening. Associated symptoms include abdominal pain. Nothing aggravates the symptoms. Nothing relieves the symptoms. She has tried nothing for the symptoms. The treatment provided no relief.    Past Medical History  Diagnosis Date  . Anxiety   . GERD (gastroesophageal reflux disease)    Past Surgical History  Procedure Laterality Date  . R shoulder surgery     History reviewed. No pertinent family history. History  Substance Use Topics  . Smoking status: Former Games developer  . Smokeless tobacco: Not on file  . Alcohol Use: No   OB History   Grav Para Term Preterm Abortions TAB SAB Ect Mult Living                 Review of Systems  Gastrointestinal: Positive for abdominal pain.  All other systems reviewed and are negative.    Allergies  Amoxicillin  Home Medications   Current Outpatient Rx  Name  Route  Sig  Dispense  Refill  . ALPRAZolam (XANAX) 0.5 MG tablet   Oral   Take 0.5 mg by mouth 3 (three) times daily as needed for sleep or anxiety.          . clindamycin (CLEOCIN) 300 MG capsule   Oral   Take 300 mg by mouth 3 (three) times daily. For 7 days; Start date 11/11/12         . esomeprazole (NEXIUM) 40 MG capsule   Oral   Take 40 mg by mouth daily as needed (acid reflux).          Marland Kitchen HYDROcodone-acetaminophen (NORCO/VICODIN) 5-325 MG per tablet   Oral   Take 0.5-1 tablets by mouth every 6 (six) hours as needed for pain.           BP 156/88  Pulse  107  Temp(Src) 98.1 F (36.7 C) (Oral)  Resp 16  SpO2 100% Physical Exam  Nursing note and vitals reviewed. Constitutional: She is oriented to person, place, and time. She appears well-developed and well-nourished. No distress.  HENT:  Head: Normocephalic and atraumatic.  Mouth/Throat: Oropharynx is clear and moist.  Neck: Normal range of motion. Neck supple.  Cardiovascular: Normal rate and regular rhythm.  Exam reveals no gallop and no friction rub.   No murmur heard. Pulmonary/Chest: Effort normal and breath sounds normal. No respiratory distress. She has no wheezes.  Abdominal: Soft. Bowel sounds are normal. She exhibits no distension and no mass. There is tenderness. There is no rebound and no guarding.  There is ttp in the epigastric region without rebound or guarding.    Musculoskeletal: Normal range of motion.  Neurological: She is alert and oriented to person, place, and time.  Skin: Skin is warm and dry. She is not diaphoretic.    ED Course   Procedures (including critical care time)  Labs Reviewed  CBC WITH DIFFERENTIAL  COMPREHENSIVE METABOLIC PANEL  LIPASE, BLOOD  URINALYSIS, ROUTINE W REFLEX MICROSCOPIC   No results found. No diagnosis found.  MDM  The ct scan shows a  partial small bowel obstruction, the etiology of which may be infectious enteritis.  She is not actively vomiting and does not appear acutely ill.  I have spoken with Dr. Luisa Hart from surgery who will see the patient in the ED.  Geoffery Lyons, MD 11/13/12 [phone]

## 2012-11-12 NOTE — ED Notes (Signed)
Last dose clindamycin 1100. Last dose vicodin 0700

## 2012-11-12 NOTE — ED Notes (Signed)
PT taken Rx clindamycin for suspected infection from tooth extraction (Monday). PT states that she has been having increasing stomach discomfort starting with and since taking clindamycin. N/v present. NO diarrhea. NAD

## 2012-11-12 NOTE — H&P (Signed)
Darlene Curtis is an 72 y.o. female.   Chief Complaint: nausea and abdominal pain HPI: asked to see patient at request of Dr. Judd Lien for abdominal pain. She has had mild to moderate diffuse abdominal pain for 3 days. Her last bowel movement was 3 days ago. She's had nausea and a small amount of vomiting. Nothing makes this better or worse. She's been on clindamycin for some dental surgery recently. Denies any blood in her stool previously. No diarrhea. CT was obtained tonight which shows partial small bowel obstruction with a thickened loop of distal jejunum. Currently, her abdominal pain has improved but she's not passing flatus.  Past Medical History  Diagnosis Date  . Anxiety   . GERD (gastroesophageal reflux disease)     Past Surgical History  Procedure Laterality Date  . R shoulder surgery      History reviewed. No pertinent family history. Social History:  reports that she has quit smoking. She does not have any smokeless tobacco history on file. She reports that she does not drink alcohol or use illicit drugs.  Allergies:  Allergies  Allergen Reactions  . Amoxicillin Other (See Comments)    Does not work for patient. "my body had become immune"     (Not in a hospital admission)  Results for orders placed during the hospital encounter of 11/12/12 (from the past 48 hour(s))  CBC WITH DIFFERENTIAL     Status: Abnormal   Collection Time    11/12/12  7:43 PM      Result Value Range   WBC 8.1  4.0 - 10.5 K/uL   RBC 5.00  3.87 - 5.11 MIL/uL   Hemoglobin 14.8  12.0 - 15.0 g/dL   HCT 30.8  65.7 - 84.6 %   MCV 85.8  78.0 - 100.0 fL   MCH 29.6  26.0 - 34.0 pg   MCHC 34.5  30.0 - 36.0 g/dL   RDW 96.2  95.2 - 84.1 %   Platelets 277  150 - 400 K/uL   Neutrophils Relative % 80 (*) 43 - 77 %   Neutro Abs 6.5  1.7 - 7.7 K/uL   Lymphocytes Relative 16  12 - 46 %   Lymphs Abs 1.3  0.7 - 4.0 K/uL   Monocytes Relative 3  3 - 12 %   Monocytes Absolute 0.3  0.1 - 1.0 K/uL    Eosinophils Relative 1  0 - 5 %   Eosinophils Absolute 0.1  0.0 - 0.7 K/uL   Basophils Relative 0  0 - 1 %   Basophils Absolute 0.0  0.0 - 0.1 K/uL  COMPREHENSIVE METABOLIC PANEL     Status: Abnormal   Collection Time    11/12/12  7:43 PM      Result Value Range   Sodium 135  135 - 145 mEq/L   Potassium 6.2 (*) 3.5 - 5.1 mEq/L   Comment: HEMOLYSIS AT THIS LEVEL MAY AFFECT RESULT   Chloride 99  96 - 112 mEq/L   CO2 27  19 - 32 mEq/L   Glucose, Bld 97  70 - 99 mg/dL   BUN 6  6 - 23 mg/dL   Creatinine, Ser 3.24  0.50 - 1.10 mg/dL   Calcium 9.6  8.4 - 40.1 mg/dL   Total Protein 8.2  6.0 - 8.3 g/dL   Albumin 4.1  3.5 - 5.2 g/dL   AST 52 (*) 0 - 37 U/L   ALT 19  0 - 35 U/L  Comment: HEMOLYSIS AT THIS LEVEL MAY AFFECT RESULT   Alkaline Phosphatase 117  39 - 117 U/L   Comment: HEMOLYSIS AT THIS LEVEL MAY AFFECT RESULT   Total Bilirubin 0.3  0.3 - 1.2 mg/dL   GFR calc non Af Amer 72 (*) >90 mL/min   GFR calc Af Amer 83 (*) >90 mL/min   Comment:            The eGFR has been calculated     using the CKD EPI equation.     This calculation has not been     validated in all clinical     situations.     eGFR's persistently     <90 mL/min signify     possible Chronic Kidney Disease.  LIPASE, BLOOD     Status: None   Collection Time    11/12/12  7:43 PM      Result Value Range   Lipase 26  11 - 59 U/L  URINALYSIS, ROUTINE W REFLEX MICROSCOPIC     Status: Abnormal   Collection Time    11/12/12  8:49 PM      Result Value Range   Color, Urine YELLOW  YELLOW   APPearance CLEAR  CLEAR   Specific Gravity, Urine 1.009  1.005 - 1.030   pH 7.5  5.0 - 8.0   Glucose, UA NEGATIVE  NEGATIVE mg/dL   Hgb urine dipstick NEGATIVE  NEGATIVE   Bilirubin Urine NEGATIVE  NEGATIVE   Ketones, ur 15 (*) NEGATIVE mg/dL   Protein, ur NEGATIVE  NEGATIVE mg/dL   Urobilinogen, UA 0.2  0.0 - 1.0 mg/dL   Nitrite NEGATIVE  NEGATIVE   Leukocytes, UA TRACE (*) NEGATIVE  URINE MICROSCOPIC-ADD ON     Status:  None   Collection Time    11/12/12  8:49 PM      Result Value Range   Squamous Epithelial / LPF RARE  RARE   WBC, UA 0-2  <3 WBC/hpf   RBC / HPF 0-2  <3 RBC/hpf   Ct Abdomen Pelvis W Contrast  11/12/2012   *RADIOLOGY REPORT*  Clinical Data: Upper abdominal pain.  Nausea and vomiting. Constipation.  CT ABDOMEN AND PELVIS WITH CONTRAST  Technique:  Multidetector CT imaging of the abdomen and pelvis was performed following the standard protocol during bolus administration of intravenous contrast.  Contrast: OMNIPAQUE IOHEXOL 300 MG/ML IV. Oral contrast was also administered.  Comparison: CT abdomen 03/19/2012.  Findings: Dilated loops of jejunum in the upper abdomen, extending into the upper pelvis, where there is an abnormal loop of distal jejunum demonstrating a markedly thickened the wall over a several centimeter segment with marked luminal narrowing.  The bowel becomes normal at the level of the proximal ileum.  There is inspissated stool-like material in the dilated segment just proximal to the abnormal segment. There is a small amount of interloop ascites and ascites in the pelvis.  As best I can tell, the SMA branches to this segment are patent and the origins of the all of the mesenteric arteries are widely patent.  Mildly distended stomach, filled with food.  Sigmoid colon diverticulosis and scattered diverticula involving the descending colon, without evidence of acute diverticulitis.  Remainder of the colon normal in appearance.  Normal appendix in the right mid abdomen, as the cecum is positioned in the right upper quadrant.  Normal appearing liver, spleen, pancreas, adrenal glands, and kidneys.  Gallbladder unremarkable by CT.  No biliary ductal dilation.  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  No significant lymphadenopathy.  Uterus atrophic consistent with age.  Calcifications in the left ovary.  No adnexal masses.  Urinary bladder unremarkable.  Numerous pelvic  phleboliths.  Bone window images demonstrate degenerative changes in the facet joints of the lower lumbar spine.  Visualized lung bases clear apart from minimal scarring.  Heart size normal with moderate three- vessel coronary atherosclerosis.  IMPRESSION:  1.  Partial small bowel obstruction.  This is due to a long segment of distal jejunum or proximal ileum that has a markedly thickened, edematous wall with luminal narrowing.  Inflammatory bowel disease and infectious enteritis are leading differential diagnostic considerations.  Ischemia is felt less likely given the distribution and given the fact that the SMA origin is widely patent. 2.  Small amount of associated ascites. 3.  No evidence of free intraperitoneal air.   Original Report Authenticated By: Hulan Saas, M.D.    Review of Systems  Constitutional: Negative.   HENT: Negative.   Eyes: Negative.   Respiratory: Negative.   Cardiovascular: Negative.   Gastrointestinal: Positive for nausea, vomiting, abdominal pain and constipation.  Genitourinary: Negative.   Musculoskeletal: Negative.   Skin: Negative.   Neurological: Negative.   Endo/Heme/Allergies: Negative.   Psychiatric/Behavioral: Negative.     Blood pressure 150/78, pulse 103, temperature 98.1 F (36.7 C), temperature source Oral, resp. rate 16, SpO2 99.00%. Physical Exam  Constitutional: She is oriented to person, place, and time. She appears well-developed and well-nourished. No distress.  HENT:  Head: Normocephalic and atraumatic.  Eyes: EOM are normal. Pupils are equal, round, and reactive to light.  Neck: Normal range of motion. Neck supple.  Cardiovascular: Normal rate and regular rhythm.   Respiratory: Effort normal and breath sounds normal.  GI: Soft. She exhibits distension. She exhibits no mass. There is no tenderness. There is no rebound and no guarding.  Musculoskeletal: Normal range of motion.  Neurological: She is alert and oriented to person, place,  and time.  Skin: Skin is warm and dry.  Psychiatric: She has a normal mood and affect. Her behavior is normal. Judgment and thought content normal.     Assessment/Plan Partial small bowel obstruction with unclear etiology  Hyperkalemia  Admission for IV fluids. She is not have much vomiting therefore we'll hold off on NG tube for now. Start empiric antibiotics for enteritis. Place NG tube if she begins to vomit. Hold potassium from all IV fluids. Repeat films in a.m.  Antwan Pandya A. 11/12/2012, 11:44 PM

## 2012-11-13 ENCOUNTER — Encounter (HOSPITAL_COMMUNITY): Payer: Self-pay | Admitting: General Surgery

## 2012-11-13 ENCOUNTER — Inpatient Hospital Stay (HOSPITAL_COMMUNITY): Payer: Medicare Other

## 2012-11-13 DIAGNOSIS — E875 Hyperkalemia: Secondary | ICD-10-CM

## 2012-11-13 LAB — COMPREHENSIVE METABOLIC PANEL
AST: 15 U/L (ref 0–37)
Albumin: 3.6 g/dL (ref 3.5–5.2)
BUN: 6 mg/dL (ref 6–23)
Calcium: 9.4 mg/dL (ref 8.4–10.5)
Creatinine, Ser: 0.86 mg/dL (ref 0.50–1.10)
Total Bilirubin: 0.3 mg/dL (ref 0.3–1.2)

## 2012-11-13 LAB — CBC
HCT: 43.1 % (ref 36.0–46.0)
MCH: 29.7 pg (ref 26.0–34.0)
MCHC: 34.8 g/dL (ref 30.0–36.0)
MCV: 85.3 fL (ref 78.0–100.0)
Platelets: 274 10*3/uL (ref 150–400)
RDW: 13.4 % (ref 11.5–15.5)

## 2012-11-13 MED ORDER — ACETAMINOPHEN 325 MG PO TABS
650.0000 mg | ORAL_TABLET | Freq: Four times a day (QID) | ORAL | Status: DC | PRN
  Administered 2012-11-13 – 2012-11-14 (×2): 650 mg via ORAL
  Filled 2012-11-13 (×2): qty 2

## 2012-11-13 MED ORDER — PANTOPRAZOLE SODIUM 40 MG IV SOLR
40.0000 mg | Freq: Every day | INTRAVENOUS | Status: DC
  Administered 2012-11-13 – 2012-11-14 (×3): 40 mg via INTRAVENOUS
  Filled 2012-11-13 (×3): qty 40

## 2012-11-13 MED ORDER — ALPRAZOLAM 0.5 MG PO TABS
0.5000 mg | ORAL_TABLET | Freq: Three times a day (TID) | ORAL | Status: DC | PRN
  Administered 2012-11-13 – 2012-11-16 (×5): 0.5 mg via ORAL
  Filled 2012-11-13 (×5): qty 1

## 2012-11-13 MED ORDER — ONDANSETRON HCL 4 MG/2ML IJ SOLN
4.0000 mg | Freq: Four times a day (QID) | INTRAMUSCULAR | Status: DC | PRN
  Administered 2012-11-14: 4 mg via INTRAVENOUS
  Filled 2012-11-13 (×2): qty 2

## 2012-11-13 MED ORDER — ENOXAPARIN SODIUM 40 MG/0.4ML ~~LOC~~ SOLN
40.0000 mg | SUBCUTANEOUS | Status: DC
  Filled 2012-11-13: qty 0.4

## 2012-11-13 MED ORDER — MORPHINE SULFATE 2 MG/ML IJ SOLN
1.0000 mg | INTRAMUSCULAR | Status: DC | PRN
  Administered 2012-11-13: 1 mg via INTRAVENOUS
  Filled 2012-11-13: qty 1

## 2012-11-13 MED ORDER — LORAZEPAM 2 MG/ML IJ SOLN
1.0000 mg | INTRAMUSCULAR | Status: DC | PRN
  Administered 2012-11-13 (×2): 1 mg via INTRAVENOUS
  Filled 2012-11-13: qty 1

## 2012-11-13 MED ORDER — CIPROFLOXACIN IN D5W 400 MG/200ML IV SOLN
400.0000 mg | Freq: Two times a day (BID) | INTRAVENOUS | Status: DC
  Administered 2012-11-13 – 2012-11-15 (×5): 400 mg via INTRAVENOUS
  Filled 2012-11-13 (×6): qty 200

## 2012-11-13 MED ORDER — DEXTROSE-NACL 5-0.9 % IV SOLN
INTRAVENOUS | Status: DC
  Administered 2012-11-13: 125 mL/h via INTRAVENOUS
  Administered 2012-11-13 – 2012-11-14 (×2): via INTRAVENOUS

## 2012-11-13 MED ORDER — PANTOPRAZOLE SODIUM 40 MG IV SOLR
40.0000 mg | Freq: Every day | INTRAVENOUS | Status: DC
Start: 2012-11-13 — End: 2012-11-13
  Filled 2012-11-13: qty 40

## 2012-11-13 MED ORDER — ENOXAPARIN SODIUM 40 MG/0.4ML ~~LOC~~ SOLN
40.0000 mg | SUBCUTANEOUS | Status: DC
  Administered 2012-11-13 – 2012-11-16 (×4): 40 mg via SUBCUTANEOUS
  Filled 2012-11-13 (×5): qty 0.4

## 2012-11-13 MED ORDER — LORAZEPAM 2 MG/ML IJ SOLN
1.0000 mg | INTRAMUSCULAR | Status: DC | PRN
  Filled 2012-11-13: qty 1

## 2012-11-13 MED ORDER — FUROSEMIDE 10 MG/ML IJ SOLN
20.0000 mg | Freq: Once | INTRAMUSCULAR | Status: AC
  Administered 2012-11-13: 20 mg via INTRAVENOUS
  Filled 2012-11-13: qty 2

## 2012-11-13 MED ORDER — ACETAMINOPHEN 650 MG RE SUPP: 650.0000 mg | Freq: Four times a day (QID) | RECTAL | Status: DC | PRN

## 2012-11-13 MED ORDER — CIPROFLOXACIN IN D5W 400 MG/200ML IV SOLN
400.0000 mg | Freq: Two times a day (BID) | INTRAVENOUS | Status: DC
  Filled 2012-11-13 (×2): qty 200

## 2012-11-13 NOTE — ED Notes (Signed)
Spoke with Dr. Luisa Hart and he said he was giving her the lasix to help bring down her potassium.

## 2012-11-13 NOTE — Progress Notes (Signed)
Seen, agree with above. In intervening time between our NP exam and my exam, pt has passed flatus.    Bowel rest.   No acute need for surgical intervention. K down from 6.2 to 3.3.  Would recheck tomorrow to make sure not further down or back up.

## 2012-11-13 NOTE — Progress Notes (Signed)
Subjective: Pt states she is feeling much better, burping, no b/v.  No flatus or bm.  Reports she had a tubal ligation 27 years ago, has had 1 obstruction requiring NGT decompression, but this was 10 years ago.  Objective: Vital signs in last 24 hours: Temp:  [98.1 F (36.7 C)-98.8 F (37.1 C)] 98.8 F (37.1 C) (08/02 0545) Pulse Rate:  [88-110] 93 (08/02 0545) Resp:  [16] 16 (08/02 0545) BP: (131-163)/(78-96) 131/85 mmHg (08/02 0545) SpO2:  [96 %-100 %] 98 % (08/02 0545) Weight:  [150 lb 12.7 oz (68.4 kg)] 150 lb 12.7 oz (68.4 kg) (08/02 0100)    Intake/Output from previous day: 08/01 0701 - 08/02 0700 In: 429.2 [I.V.:429.2] Out: 300 [Urine:300] Intake/Output this shift:    General appearance: alert, cooperative, appears stated age and no distress Resp: clear to auscultation bilaterally Cardio: regular rate and rhythm, S1, S2 normal, no murmur, click, rub or gallop GI: +BS round and mildly distended, mild diffuse tenderness.  No masses or hernias.  Midline incision scar Extremities: extremities normal, atraumatic, no cyanosis or edema  Lab Results:   Recent Labs  11/12/12 1943 11/13/12 0610  WBC 8.1 7.6  HGB 14.8 15.0  HCT 42.9 43.1  PLT 277 274   BMET  Recent Labs  11/12/12 1943 11/13/12 0610  NA 135 138  K 6.2* 3.3*  CL 99 98  CO2 27 27  GLUCOSE 97 154*  BUN 6 6  CREATININE 0.81 0.86  CALCIUM 9.6 9.4   PT/INR No results found for this basename: LABPROT, INR,  in the last 72 hours ABG No results found for this basename: PHART, PCO2, PO2, HCO3,  in the last 72 hours  Studies/Results: Ct Abdomen Pelvis W Contrast  11/12/2012   *RADIOLOGY REPORT*  Clinical Data: Upper abdominal pain.  Nausea and vomiting. Constipation.  CT ABDOMEN AND PELVIS WITH CONTRAST  Technique:  Multidetector CT imaging of the abdomen and pelvis was performed following the standard protocol during bolus administration of intravenous contrast.  Contrast: OMNIPAQUE IOHEXOL 300  MG/ML IV. Oral contrast was also administered.  Comparison: CT abdomen 03/19/2012.  Findings: Dilated loops of jejunum in the upper abdomen, extending into the upper pelvis, where there is an abnormal loop of distal jejunum demonstrating a markedly thickened the wall over a several centimeter segment with marked luminal narrowing.  The bowel becomes normal at the level of the proximal ileum.  There is inspissated stool-like material in the dilated segment just proximal to the abnormal segment. There is a small amount of interloop ascites and ascites in the pelvis.  As best I can tell, the SMA branches to this segment are patent and the origins of the all of the mesenteric arteries are widely patent.  Mildly distended stomach, filled with food.  Sigmoid colon diverticulosis and scattered diverticula involving the descending colon, without evidence of acute diverticulitis.  Remainder of the colon normal in appearance.  Normal appendix in the right mid abdomen, as the cecum is positioned in the right upper quadrant.  Normal appearing liver, spleen, pancreas, adrenal glands, and kidneys.  Gallbladder unremarkable by CT.  No biliary ductal dilation.  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  No significant lymphadenopathy.  Uterus atrophic consistent with age.  Calcifications in the left ovary.  No adnexal masses.  Urinary bladder unremarkable.  Numerous pelvic phleboliths.  Bone window images demonstrate degenerative changes in the facet joints of the lower lumbar spine.  Visualized lung bases clear apart from minimal scarring.  Heart size normal with moderate three- vessel coronary atherosclerosis.  IMPRESSION:  1.  Partial small bowel obstruction.  This is due to a long segment of distal jejunum or proximal ileum that has a markedly thickened, edematous wall with luminal narrowing.  Inflammatory bowel disease and infectious enteritis are leading differential diagnostic considerations.  Ischemia  is felt less likely given the distribution and given the fact that the SMA origin is widely patent. 2.  Small amount of associated ascites. 3.  No evidence of free intraperitoneal air.   Original Report Authenticated By: Hulan Saas, M.D.    Anti-infectives: Anti-infectives   Start     Dose/Rate Route Frequency Ordered Stop   11/13/12 0400  ciprofloxacin (CIPRO) IVPB 400 mg     400 mg 200 mL/hr over 60 Minutes Intravenous Every 12 hours 11/13/12 0203     11/13/12 0100  ciprofloxacin (CIPRO) IVPB 400 mg  Status:  Discontinued     400 mg 200 mL/hr over 60 Minutes Intravenous Every 12 hours 11/13/12 0058 11/13/12 0202      Assessment/Plan: Partial small bowel obstruction,  -likely due to adhesions given history of abdominal surgery -improving with conservative management, WBC normal, denies abdominal pain, n/v.  -Continue NPO -XR today -IVF -ambulate  Presumed enteritis -IV cipro, no diarrhea  Hyperkalemia -given 1 dose of lasix, K now 3.3.  Repeat BMET in AM   LOS: 1 day   Bonner Puna [facility] ANP-BC Pager [phone]  11/13/2012 8:28 AM

## 2012-11-14 ENCOUNTER — Encounter (HOSPITAL_COMMUNITY): Payer: Self-pay | Admitting: *Deleted

## 2012-11-14 LAB — BASIC METABOLIC PANEL
BUN: 4 mg/dL — ABNORMAL LOW (ref 6–23)
Chloride: 109 mEq/L (ref 96–112)
GFR calc Af Amer: 66 mL/min — ABNORMAL LOW (ref >90)
GFR calc non Af Amer: 57 mL/min — ABNORMAL LOW (ref >90)
Glucose, Bld: 112 mg/dL — ABNORMAL HIGH (ref 70–99)
Potassium: 3.7 mEq/L (ref 3.5–5.1)
Sodium: 141 mEq/L (ref 135–145)

## 2012-11-14 NOTE — Progress Notes (Signed)
Improving hopefully avoid laparotomy. Advance diet.

## 2012-11-14 NOTE — Progress Notes (Signed)
Subjective: Pt had a small bm yesterday, passing flatus.  Denies further abdominal pain.  Ambulating in hallways.  Objective: Vital signs in last 24 hours: Temp:  [98.1 F (36.7 C)-99.3 F (37.4 C)] 98.1 F (36.7 C) (08/03 0519) Pulse Rate:  [70-88] 70 (08/03 0519) Resp:  [16-17] 16 (08/03 0519) BP: (117-132)/(75-79) 119/77 mmHg (08/03 0519) SpO2:  [98 %-100 %] 100 % (08/03 0519) Last BM Date: 11/10/12  Intake/Output from previous day: 08/02 0701 - 08/03 0700 In: 2617 [I.V.:2617] Out: 1050 [Urine:1050] Intake/Output this shift:   PE General appearance: alert, cooperative, appears stated age and no distress  Resp: clear to auscultation bilaterally  Cardio: regular rate and rhythm, S1, S2 normal, no murmur, click, rub or gallop  GI: +BS round and non distended, mild tenderness to lower abdomen. No masses or hernias. Midline incision scar  Extremities: extremities normal, atraumatic, no cyanosis or edema   Lab Results:   Recent Labs  11/12/12 1943 11/13/12 0610  WBC 8.1 7.6  HGB 14.8 15.0  HCT 42.9 43.1  PLT 277 274   BMET  Recent Labs  11/13/12 0610 11/14/12 0410  NA 138 141  K 3.3* 3.7  CL 98 109  CO2 27 25  GLUCOSE 154* 112*  BUN 6 4*  CREATININE 0.86 0.98  CALCIUM 9.4 8.4   Studies/Results: Ct Abdomen Pelvis W Contrast  11/12/2012   *RADIOLOGY REPORT*  Clinical Data: Upper abdominal pain.  Nausea and vomiting. Constipation.  CT ABDOMEN AND PELVIS WITH CONTRAST  Technique:  Multidetector CT imaging of the abdomen and pelvis was performed following the standard protocol during bolus administration of intravenous contrast.  Contrast: OMNIPAQUE IOHEXOL 300 MG/ML IV. Oral contrast was also administered.  Comparison: CT abdomen 03/19/2012.  Findings: Dilated loops of jejunum in the upper abdomen, extending into the upper pelvis, where there is an abnormal loop of distal jejunum demonstrating a markedly thickened the wall over a several centimeter segment  with marked luminal narrowing.  The bowel becomes normal at the level of the proximal ileum.  There is inspissated stool-like material in the dilated segment just proximal to the abnormal segment. There is a small amount of interloop ascites and ascites in the pelvis.  As best I can tell, the SMA branches to this segment are patent and the origins of the all of the mesenteric arteries are widely patent.  Mildly distended stomach, filled with food.  Sigmoid colon diverticulosis and scattered diverticula involving the descending colon, without evidence of acute diverticulitis.  Remainder of the colon normal in appearance.  Normal appendix in the right mid abdomen, as the cecum is positioned in the right upper quadrant.  Normal appearing liver, spleen, pancreas, adrenal glands, and kidneys.  Gallbladder unremarkable by CT.  No biliary ductal dilation.  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  No significant lymphadenopathy.  Uterus atrophic consistent with age.  Calcifications in the left ovary.  No adnexal masses.  Urinary bladder unremarkable.  Numerous pelvic phleboliths.  Bone window images demonstrate degenerative changes in the facet joints of the lower lumbar spine.  Visualized lung bases clear apart from minimal scarring.  Heart size normal with moderate three- vessel coronary atherosclerosis.  IMPRESSION:  1.  Partial small bowel obstruction.  This is due to a long segment of distal jejunum or proximal ileum that has a markedly thickened, edematous wall with luminal narrowing.  Inflammatory bowel disease and infectious enteritis are leading differential diagnostic considerations.  Ischemia is felt less likely given the distribution  and given the fact that the SMA origin is widely patent. 2.  Small amount of associated ascites. 3.  No evidence of free intraperitoneal air.   Original Report Authenticated By: Hulan Saas, M.D.   Dg Abd Portable 2v  11/13/2012   *RADIOLOGY REPORT*   Clinical Data: Evaluate for bowel obstruction.  PORTABLE ABDOMEN - 2 VIEW  Comparison: CT of the abdomen and pelvis 11/12/2012.  Findings: Oral contrast administered from recent CT examination is now noted in the colon.  There is several dilated loops of small bowel in the central abdomen measuring up to 3.5 cm in diameter. Air fluid levels are noted on the left lateral decubitus view.  No gross pneumoperitoneum. Iodinated contrast material is noted in the urinary bladder (secondary to yesterday's CT examination).  IMPRESSION: 1.  Bowel gas pattern is suggestive of a partial small bowel obstruction. Oral contrast material from yesterday's CT examination is now within the colon. 2.  No pneumoperitoneum.   Original Report Authenticated By: Trudie Reed, M.D.    Anti-infectives: Anti-infectives   Start     Dose/Rate Route Frequency Ordered Stop   11/13/12 0400  ciprofloxacin (CIPRO) IVPB 400 mg     400 mg 200 mL/hr over 60 Minutes Intravenous Every 12 hours 11/13/12 0203     11/13/12 0100  ciprofloxacin (CIPRO) IVPB 400 mg  Status:  Discontinued     400 mg 200 mL/hr over 60 Minutes Intravenous Every 12 hours 11/13/12 0058 11/13/12 0202      Assessment/Plan: Partial small bowel obstruction,  -likely due to adhesions given history of abdominal surgery  -improving with conservative management, WBC normal, denies abdominal pain, n/v. Had a small bm yesterday. -start clear liquid diet and advance as tolerated -ambulate  -pain control and antiemetics Presumed enteritis  -IV cipro, no diarrhea  Hyperkalemia -resolved VTE prophylaxis -SCDs, lovenox, mobilize   Dispo: discharge in AM if no pain, n/v develops.   LOS: 2 days    Bonner Puna [facility] ANP-BC Pager [phone]  11/14/2012 8:09 AM

## 2012-11-15 ENCOUNTER — Inpatient Hospital Stay (HOSPITAL_COMMUNITY): Payer: Medicare Other

## 2012-11-15 LAB — CBC
HCT: 40.8 % (ref 36.0–46.0)
Hemoglobin: 13 g/dL (ref 12.0–15.0)
MCH: 28.4 pg (ref 26.0–34.0)
MCHC: 31.9 g/dL (ref 30.0–36.0)
RBC: 4.58 MIL/uL (ref 3.87–5.11)

## 2012-11-15 MED ORDER — ONDANSETRON HCL 4 MG/5ML PO SOLN
4.0000 mg | Freq: Four times a day (QID) | ORAL | Status: DC | PRN
  Filled 2012-11-15: qty 5

## 2012-11-15 MED ORDER — CIPROFLOXACIN HCL 500 MG PO TABS
500.0000 mg | ORAL_TABLET | Freq: Two times a day (BID) | ORAL | Status: DC
  Administered 2012-11-15 – 2012-11-17 (×4): 500 mg via ORAL
  Filled 2012-11-15 (×6): qty 1

## 2012-11-15 MED ORDER — DIPHENHYDRAMINE HCL 12.5 MG/5ML PO ELIX: 12.5000 mg | ORAL_SOLUTION | Freq: Every evening | ORAL | Status: DC | PRN

## 2012-11-15 MED ORDER — ZOLPIDEM TARTRATE 5 MG PO TABS: 5.0000 mg | ORAL_TABLET | Freq: Every evening | ORAL | Status: DC | PRN

## 2012-11-15 MED ORDER — PANTOPRAZOLE SODIUM 40 MG PO TBEC
40.0000 mg | DELAYED_RELEASE_TABLET | Freq: Every day | ORAL | Status: DC
  Administered 2012-11-15 – 2012-11-16 (×2): 40 mg via ORAL
  Filled 2012-11-15 (×2): qty 1

## 2012-11-15 MED ORDER — ALUM & MAG HYDROXIDE-SIMETH 200-200-20 MG/5ML PO SUSP
30.0000 mL | Freq: Four times a day (QID) | ORAL | Status: DC | PRN
  Administered 2012-11-15 (×2): 30 mL via ORAL
  Filled 2012-11-15 (×2): qty 30

## 2012-11-15 MED ORDER — BISACODYL 10 MG RE SUPP
10.0000 mg | Freq: Two times a day (BID) | RECTAL | Status: DC | PRN
  Administered 2012-11-15: 10 mg via RECTAL
  Filled 2012-11-15 (×2): qty 1

## 2012-11-15 NOTE — Progress Notes (Signed)
UR COMPLETED  

## 2012-11-15 NOTE — Progress Notes (Signed)
She feels ok now.  Has some heartburn and some upper abdominal discomfort mostly when she drank coffee. No emesis.  Having flatus and some bms.  Xray looks fine.  She may have had enteritis by initial ct and response to conservative therapy with abx.  Will cont abx and advance diet tomorrow.  Possibly home tomorrow. I do think she will need colonoscopy and possibly capsule if she gets better as outpatient. If gets worse or not better may need laparoscopy as doesn't really have history of abdominal surgery except a tubal ligation.

## 2012-11-15 NOTE — Progress Notes (Signed)
Pt requesting medicine to help sleep. MD notified and new order for benadryl. Medicine changed to [facility] per pharmacy. Pt refused sleep medicine this PM. Baron Hamper, RN 11/15/2012

## 2012-11-15 NOTE — Progress Notes (Signed)
  Subjective: Pt feeling worse than she did yesterday.  Had 2 small bms yesterday, but developed nausea with dinner and this morning.  Associated with bloating and abdominal pain.  No vomiting.   IV infiltrated overnight.  Objective: Vital signs in last 24 hours: Temp:  [98.1 F (36.7 C)-98.5 F (36.9 C)] 98.3 F (36.8 C) (08/04 0618) Pulse Rate:  [63-78] 63 (08/04 0618) Resp:  [16-20] 16 (08/04 0618) BP: (120-145)/(69-83) 120/69 mmHg (08/04 0618) SpO2:  [98 %-100 %] 99 % (08/04 0618) Last BM Date: 11/14/12  Intake/Output from previous day: 08/03 0701 - 08/04 0700 In: 1080 [P.O.:1080] Out: -  Intake/Output this shift:   PE  General appearance: alert, cooperative, appears stated age and no distress  Resp: clear to auscultation bilaterally  Cardio: regular rate and rhythm, S1, S2 normal, no murmur, click, rub or gallop  GI: +BS round and non distended, mild tenderness to lower abdomen. No masses or hernias. Midline incision scar  Extremities: extremities normal, atraumatic, no cyanosis or edema   Lab Results:   Recent Labs  11/12/12 1943 11/13/12 0610  WBC 8.1 7.6  HGB 14.8 15.0  HCT 42.9 43.1  PLT 277 274   BMET  Recent Labs  11/13/12 0610 11/14/12 0410  NA 138 141  K 3.3* 3.7  CL 98 109  CO2 27 25  GLUCOSE 154* 112*  BUN 6 4*  CREATININE 0.86 0.98  CALCIUM 9.4 8.4    Studies/Results: Dg Abd Portable 2v  11/13/2012   *RADIOLOGY REPORT*  Clinical Data: Evaluate for bowel obstruction.  PORTABLE ABDOMEN - 2 VIEW  Comparison: CT of the abdomen and pelvis 11/12/2012.  Findings: Oral contrast administered from recent CT examination is now noted in the colon.  There is several dilated loops of small bowel in the central abdomen measuring up to 3.5 cm in diameter. Air fluid levels are noted on the left lateral decubitus view.  No gross pneumoperitoneum. Iodinated contrast material is noted in the urinary bladder (secondary to yesterday's CT examination).  IMPRESSION:  1.  Bowel gas pattern is suggestive of a partial small bowel obstruction. Oral contrast material from yesterday's CT examination is now within the colon. 2.  No pneumoperitoneum.   Original Report Authenticated By: Trudie Reed, M.D.    Anti-infectives: Anti-infectives   Start     Dose/Rate Route Frequency Ordered Stop   11/13/12 0400  ciprofloxacin (CIPRO) IVPB 400 mg     400 mg 200 mL/hr over 60 Minutes Intravenous Every 12 hours 11/13/12 0203     11/13/12 0100  ciprofloxacin (CIPRO) IVPB 400 mg  Status:  Discontinued     400 mg 200 mL/hr over 60 Minutes Intravenous Every 12 hours 11/13/12 0058 11/13/12 0202      Assessment/Plan: Partial small bowel obstruction,  -likely due to adhesions given history of abdominal surgery  -a little worse today, had 2 small bms, but developed nausea.  Obtain xr, give dulcolax suppository.  May need to back off full liquids if symptoms persist. -ambulate  -pain control and antiemetics  -CBC in AM Presumed enteritis  -cipro, no diarrhea, some of the abdominal pain may be secondary to this.  Hyperkalemia  -resolved  VTE prophylaxis  -SCDs, lovenox, mobilize    LOS: 3 days    Bonner Puna [facility] ANP-BC Pager [phone]  11/15/2012 8:24 AM

## 2012-11-16 DIAGNOSIS — R110 Nausea: Secondary | ICD-10-CM

## 2012-11-16 LAB — CBC
HCT: 38 % (ref 36.0–46.0)
MCH: 28.2 pg (ref 26.0–34.0)
MCV: 88 fL (ref 78.0–100.0)
RBC: 4.32 MIL/uL (ref 3.87–5.11)
WBC: 4.3 10*3/uL (ref 4.0–10.5)

## 2012-11-16 MED ORDER — ONDANSETRON HCL 4 MG PO TABS
4.0000 mg | ORAL_TABLET | Freq: Four times a day (QID) | ORAL | Status: DC | PRN
  Administered 2012-11-16 – 2012-11-17 (×2): 4 mg via ORAL
  Filled 2012-11-16 (×2): qty 1

## 2012-11-16 MED ORDER — BISACODYL 10 MG RE SUPP
10.0000 mg | Freq: Once | RECTAL | Status: AC
  Administered 2012-11-16: 10 mg via RECTAL

## 2012-11-16 NOTE — Progress Notes (Signed)
I am still not entirely sure of source of current admission but better on cipro.  She is not obstructed. We will give her regular diet today and dc tomorrow if does well.  She will need outpatient follow up with gi for consideration of colonoscopy and capsule endoscopy

## 2012-11-16 NOTE — Progress Notes (Signed)
  Subjective: Pt feeling better today, had 2 small bms, but still feeling nauseated.  No vomiting.  +flatus.  Ambulating in hallways.   Objective: Vital signs in last 24 hours: Temp:  [97.6 F (36.4 C)-98.6 F (37 C)] 97.6 F (36.4 C) (08/05 0532) Pulse Rate:  [66-79] 66 (08/05 0532) Resp:  [15-18] 18 (08/05 0532) BP: (118-143)/(63-76) 118/63 mmHg (08/05 0532) SpO2:  [100 %] 100 % (08/05 0532) Last BM Date: 11/14/12  PE  General appearance: alert, cooperative, appears stated age and no distress  Resp: clear to auscultation bilaterally  Cardio: regular rate and rhythm, S1, S2 normal, no murmur, click, rub or gallop  GI: +BS round and non distended, mild tenderness to lower abdomen. No masses or hernias. Midline incision scar  Extremities: extremities normal, atraumatic, no cyanosis or edema   Lab Results:   Recent Labs  11/15/12 0855 11/16/12 0605  WBC 3.9* 4.3  HGB 13.0 12.2  HCT 40.8 38.0  PLT 238 247   BMET  Recent Labs  11/14/12 0410  NA 141  K 3.7  CL 109  CO2 25  GLUCOSE 112*  BUN 4*  CREATININE 0.98  CALCIUM 8.4   Studies/Results: Dg Abd 1 View  11/15/2012   *RADIOLOGY REPORT*  Clinical Data: Partial bowel obstruction and nausea  ABDOMEN - 1 VIEW  Comparison:  November 13, 2012  Findings:  Contrast is present throughout the colon.  There are a few loops of persistent mildly dilated small bowel with borderline thickened walls in the left mid abdomen.  The degree of dilatation and wall thickening appears slightly less than on prior study. There is no new bowel dilatation or wall thickening.  No free air is seen on this supine only examination.  There are phleboliths in the pelvis.  IMPRESSION: There remains borderline dilated loops of bowel with borderline prominent wall thickness in the left abdomen.  A degree of residual partial obstruction may remain.  Contrast has progressed within the colon.  No free air is seen on this supine examination.   Original Report  Authenticated By: Bretta Bang, M.D.    Anti-infectives: Anti-infectives   Start     Dose/Rate Route Frequency Ordered Stop   11/15/12 2000  ciprofloxacin (CIPRO) tablet 500 mg     500 mg Oral 2 times daily 11/15/12 0824 11/20/12 1959   11/13/12 0400  ciprofloxacin (CIPRO) IVPB 400 mg  Status:  Discontinued     400 mg 200 mL/hr over 60 Minutes Intravenous Every 12 hours 11/13/12 0203 11/15/12 0824   11/13/12 0100  ciprofloxacin (CIPRO) IVPB 400 mg  Status:  Discontinued     400 mg 200 mL/hr over 60 Minutes Intravenous Every 12 hours 11/13/12 0058 11/13/12 0202      Assessment/Plan: Partial small bowel obstruction,  -likely due to adhesions given history of abdominal surgery  -contrast in colon on XR yesterday, advance diet as tolerated -ambulate  -pain control and antiemetics  -hopefully home tomorrow. -she will need an outpatient GI referral  Presumed enteritis  -cipro started on 8/2, continue Hyperkalemia  -resolved  VTE prophylaxis  -SCDs, lovenox, mobilize    LOS: 4 days    Darlene Curtis, [facility] ANP-BC Pager [phone]  11/16/2012 9:00 AM

## 2012-11-17 MED ORDER — ONDANSETRON HCL 4 MG PO TABS: 4.0000 mg | ORAL_TABLET | Freq: Three times a day (TID) | ORAL | Status: DC | PRN

## 2012-11-17 MED ORDER — CIPROFLOXACIN HCL 500 MG PO TABS: 500.0000 mg | ORAL_TABLET | Freq: Two times a day (BID) | ORAL | Status: AC

## 2012-11-17 NOTE — Progress Notes (Signed)
Patietn discharged to home with instructions.

## 2012-11-17 NOTE — Discharge Summary (Signed)
Physician Discharge Summary  Darlene Curtis:[id number] DOB: January 09, 1941 DOA: 11/12/2012  PCP: Lorenda Peck, MD  Consultation: none  Admit date: 11/12/2012 Discharge date: 11/17/2012  Recommendations for Outpatient Follow-up:    Follow-up Information   Follow up with [facility].   Contact information:   [address] [place] [address] [phone]      Follow up with [facility]. (schedule new patient evaluation)    Contact information:   [address] [place] [address] [phone]     Discharge Diagnoses:  1. Enteritis 2. Partial small bowel obstruction 3. Hyperkalemia  Surgical Procedure: none  Discharge Condition: stable Disposition: home  Diet recommendation: high fiber  Filed Weights   11/13/12 0100  Weight: 150 lb 12.7 oz (68.4 kg)    Filed Vitals:   11/17/12 0552  BP: 119/69  Pulse: 69  Temp: 97.7 F (36.5 C)  Resp: 16   Hospital Course:  Darlene Curtis is a 72 year old female with a history of anxiety, GERD, open tubal ligation 27 years ago who presented with abdominal pain and nausea.  She was found to have hyperkalemia, enteritis on CT scan and a partial small bowel obstruction.  She was started on IVF, repeat potassium was normal.  She was started on cipro for empiric enteritis treatment, this was changed to oral when she was able to tolerate.  She improved slowly.  She began having bowel movements, diet was advanced.  Her abdominal pain resolved.  She was therefore felt stable for discharge.  We did recommend a GI evaluation.   General appearance: alert and oriented. Calm and cooperative No acute distress. VSS. Afebrile.  Resp: clear to auscultation bilaterally  Cardio: S1S1 RRR without murmurs or gallops. No edema. GI: soft round and nontender. +BS x4 quadrants. No organomegaly, hernias or masses.  Pulses: +2 bilateral distal pulses without cyanosis   Discharge  Instructions     Medication List    STOP taking these medications       clindamycin 300 MG capsule  Commonly known as:  CLEOCIN     HYDROcodone-acetaminophen 5-325 MG per tablet  Commonly known as:  NORCO/VICODIN      TAKE these medications       ALPRAZolam 0.5 MG tablet  Commonly known as:  XANAX  Take 0.5 mg by mouth 3 (three) times daily as needed for sleep or anxiety.     ciprofloxacin 500 MG tablet  Commonly known as:  CIPRO  Take 1 tablet (500 mg total) by mouth 2 (two) times daily.     esomeprazole 40 MG capsule  Commonly known as:  NEXIUM  Take 40 mg by mouth daily as needed (acid reflux).     ondansetron 4 MG tablet  Commonly known as:  ZOFRAN  Take 1 tablet (4 mg total) by mouth every 8 (eight) hours as needed for nausea.           Follow-up Information   Follow up with [facility].   Contact information:   [address] [place] [address] [phone]      Follow up with [facility]. (schedule new patient evaluation)    Contact information:   [address] [place] [address] [phone]       The results of significant diagnostics from this hospitalization (including imaging, microbiology, ancillary and laboratory) are listed below for reference.    Significant Diagnostic Studies: Dg Abd 1 View  11/15/2012   *  RADIOLOGY REPORT*  Clinical Data: Partial bowel obstruction and nausea  ABDOMEN - 1 VIEW  Comparison:  November 13, 2012  Findings:  Contrast is present throughout the colon.  There are a few loops of persistent mildly dilated small bowel with borderline thickened walls in the left mid abdomen.  The degree of dilatation and wall thickening appears slightly less than on prior study. There is no new bowel dilatation or wall thickening.  No free air is seen on this supine only examination.  There are phleboliths in the pelvis.  IMPRESSION: There remains borderline dilated loops of bowel  with borderline prominent wall thickness in the left abdomen.  A degree of residual partial obstruction may remain.  Contrast has progressed within the colon.  No free air is seen on this supine examination.   Original Report Authenticated By: Bretta Bang, M.D.   Ct Abdomen Pelvis W Contrast  11/12/2012   *RADIOLOGY REPORT*  Clinical Data: Upper abdominal pain.  Nausea and vomiting. Constipation.  CT ABDOMEN AND PELVIS WITH CONTRAST  Technique:  Multidetector CT imaging of the abdomen and pelvis was performed following the standard protocol during bolus administration of intravenous contrast.  Contrast: OMNIPAQUE IOHEXOL 300 MG/ML IV. Oral contrast was also administered.  Comparison: CT abdomen 03/19/2012.  Findings: Dilated loops of jejunum in the upper abdomen, extending into the upper pelvis, where there is an abnormal loop of distal jejunum demonstrating a markedly thickened the wall over a several centimeter segment with marked luminal narrowing.  The bowel becomes normal at the level of the proximal ileum.  There is inspissated stool-like material in the dilated segment just proximal to the abnormal segment. There is a small amount of interloop ascites and ascites in the pelvis.  As best I can tell, the SMA branches to this segment are patent and the origins of the all of the mesenteric arteries are widely patent.  Mildly distended stomach, filled with food.  Sigmoid colon diverticulosis and scattered diverticula involving the descending colon, without evidence of acute diverticulitis.  Remainder of the colon normal in appearance.  Normal appendix in the right mid abdomen, as the cecum is positioned in the right upper quadrant.  Normal appearing liver, spleen, pancreas, adrenal glands, and kidneys.  Gallbladder unremarkable by CT.  No biliary ductal dilation.  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  No significant lymphadenopathy.  Uterus atrophic consistent with age.   Calcifications in the left ovary.  No adnexal masses.  Urinary bladder unremarkable.  Numerous pelvic phleboliths.  Bone window images demonstrate degenerative changes in the facet joints of the lower lumbar spine.  Visualized lung bases clear apart from minimal scarring.  Heart size normal with moderate three- vessel coronary atherosclerosis.  IMPRESSION:  1.  Partial small bowel obstruction.  This is due to a long segment of distal jejunum or proximal ileum that has a markedly thickened, edematous wall with luminal narrowing.  Inflammatory bowel disease and infectious enteritis are leading differential diagnostic considerations.  Ischemia is felt less likely given the distribution and given the fact that the SMA origin is widely patent. 2.  Small amount of associated ascites. 3.  No evidence of free intraperitoneal air.   Original Report Authenticated By: Hulan Saas, M.D.   Dg Abd Portable 2v  11/13/2012   *RADIOLOGY REPORT*  Clinical Data: Evaluate for bowel obstruction.  PORTABLE ABDOMEN - 2 VIEW  Comparison: CT of the abdomen and pelvis 11/12/2012.  Findings: Oral contrast administered from recent CT examination is now  noted in the colon.  There is several dilated loops of small bowel in the central abdomen measuring up to 3.5 cm in diameter. Air fluid levels are noted on the left lateral decubitus view.  No gross pneumoperitoneum. Iodinated contrast material is noted in the urinary bladder (secondary to yesterday's CT examination).  IMPRESSION: 1.  Bowel gas pattern is suggestive of a partial small bowel obstruction. Oral contrast material from yesterday's CT examination is now within the colon. 2.  No pneumoperitoneum.   Original Report Authenticated By: Trudie Reed, M.D.   Labs: Basic Metabolic Panel:  Recent Labs Lab 11/12/12 1943 11/13/12 0610 11/14/12 0410  NA 135 138 141  K 6.2* 3.3* 3.7  CL 99 98 109  CO2 27 27 25   GLUCOSE 97 154* 112*  BUN 6 6 4*  CREATININE 0.81 0.86 0.98   CALCIUM 9.6 9.4 8.4   Liver Function Tests:  Recent Labs Lab 11/12/12 1943 11/13/12 0610  AST 52* 15  ALT 19 10  ALKPHOS 117 122*  BILITOT 0.3 0.3  PROT 8.2 7.0  ALBUMIN 4.1 3.6    Recent Labs Lab 11/12/12 1943  LIPASE 26   No results found for this basename: AMMONIA,  in the last 168 hours CBC:  Recent Labs Lab 11/12/12 1943 11/13/12 0610 11/15/12 0855 11/16/12 0605  WBC 8.1 7.6 3.9* 4.3  NEUTROABS 6.5  --   --   --   HGB 14.8 15.0 13.0 12.2  HCT 42.9 43.1 40.8 38.0  MCV 85.8 85.3 89.1 88.0  PLT 277 274 238 247   Active Problems:   Partial small bowel obstruction   Hyperkalemia Enteritis   Time coordinating discharge: 30 mins  Signed:  Rubin Dais, ANP-BC

## 2013-01-10 ENCOUNTER — Other Ambulatory Visit: Payer: Self-pay | Admitting: Gastroenterology

## 2013-03-24 ENCOUNTER — Other Ambulatory Visit: Payer: Self-pay | Admitting: Internal Medicine

## 2013-03-24 ENCOUNTER — Ambulatory Visit
Admission: RE | Admit: 2013-03-24 | Discharge: 2013-03-24 | Disposition: A | Discharge status: Home / Self Care | Payer: Medicare Other | Source: Ambulatory Visit | Attending: Internal Medicine | Admitting: Internal Medicine

## 2013-03-24 DIAGNOSIS — M545 Low back pain: Secondary | ICD-10-CM

## 2013-05-08 ENCOUNTER — Encounter (HOSPITAL_COMMUNITY): Payer: Self-pay | Admitting: Emergency Medicine

## 2013-05-08 ENCOUNTER — Emergency Department (INDEPENDENT_AMBULATORY_CARE_PROVIDER_SITE_OTHER)
Admission: EM | Admit: 2013-05-08 | Discharge: 2013-05-08 | Disposition: A | Discharge status: Home / Self Care | Payer: Self-pay | Source: Home / Self Care | Attending: Family Medicine | Admitting: Family Medicine

## 2013-05-08 DIAGNOSIS — Z733 Stress, not elsewhere classified: Secondary | ICD-10-CM

## 2013-05-08 DIAGNOSIS — F439 Reaction to severe stress, unspecified: Secondary | ICD-10-CM

## 2013-05-08 DIAGNOSIS — G4700 Insomnia, unspecified: Secondary | ICD-10-CM

## 2013-05-08 DIAGNOSIS — G44209 Tension-type headache, unspecified, not intractable: Secondary | ICD-10-CM

## 2013-05-08 MED ORDER — ZOLPIDEM TARTRATE 5 MG PO TABS: 5.0000 mg | ORAL_TABLET | Freq: Every evening | ORAL | Status: DC | PRN

## 2013-05-08 MED ORDER — BUTALBITAL-APAP-CAFFEINE 50-325-40 MG PO TABS: 1.0000 | ORAL_TABLET | Freq: Four times a day (QID) | ORAL | Status: DC | PRN

## 2013-05-08 NOTE — Discharge Instructions (Signed)
Tension Headache A tension headache is a feeling of pain, pressure, or aching often felt over the front and sides of the head. The pain can be dull or can feel tight (constricting). It is the most common type of headache. Tension headaches are not normally associated with nausea or vomiting and do not get worse with physical activity. Tension headaches can last 30 minutes to several days.  CAUSES  The exact cause is not known, but it may be caused by chemicals and hormones in the brain that lead to pain. Tension headaches often begin after stress, anxiety, or depression. Other triggers may include:  Alcohol.  Caffeine (too much or withdrawal).  Respiratory infections (colds, flu, sinus infections).  Dental problems or teeth clenching.  Fatigue.  Holding your head and neck in one position too long while using a computer. SYMPTOMS   Pressure around the head.   Dull, aching head pain.   Pain felt over the front and sides of the head.   Tenderness in the muscles of the head, neck, and shoulders. DIAGNOSIS  A tension headache is often diagnosed based on:   Symptoms.   Physical examination.   A CT scan or MRI of your head. These tests may be ordered if symptoms are severe or unusual. TREATMENT  Medicines may be given to help relieve symptoms.  HOME CARE INSTRUCTIONS   Only take over-the-counter or prescription medicines for pain or discomfort as directed by your caregiver.   Lie down in a dark, quiet room when you have a headache.   Keep a journal to find out what may be triggering your headaches. For example, write down:  What you eat and drink.  How much sleep you get.  Any change to your diet or medicines.  Try massage or other relaxation techniques.   Ice packs or heat applied to the head and neck can be used. Use these 3 to 4 times per day for 15 to 20 minutes each time, or as needed.   Limit stress.   Sit up straight, and do not tense your muscles.    Quit smoking if you smoke.  Limit alcohol use.  Decrease the amount of caffeine you drink, or stop drinking caffeine.  Eat and exercise regularly.  Get 7 to 9 hours of sleep, or as recommended by your caregiver.  Avoid excessive use of pain medicine as recurrent headaches can occur.  SEEK MEDICAL CARE IF:   You have problems with the medicines you were prescribed.  Your medicines do not work.  You have a change from the usual headache.  You have nausea or vomiting. SEEK IMMEDIATE MEDICAL CARE IF:   Your headache becomes severe.  You have a fever.  You have a stiff neck.  You have loss of vision.  You have muscular weakness or loss of muscle control.  You lose your balance or have trouble walking.  You feel faint or pass out.  You have severe symptoms that are different from your first symptoms. MAKE SURE YOU:   Understand these instructions.  Will watch your condition.  Will get help right away if you are not doing well or get worse. Document Released: 03/31/2005 Document Revised: 06/23/2011 Document Reviewed: 03/21/2011 [facility] Patient Information 2014 [place], [place].  Stress Stress-related medical problems are becoming increasingly common. The body has a built-in physical response to stressful situations. Faced with pressure, challenge or danger, we need to react quickly. Our bodies release hormones such as cortisol and adrenaline to help do  this. These hormones are part of the "fight or flight" response and affect the metabolic rate, heart rate and blood pressure, resulting in a heightened, stressed state that prepares the body for optimum performance in dealing with a stressful situation. It is likely that early man required these mechanisms to stay alive, but usually modern stresses do not call for this, and the same hormones released in today's world can damage health and reduce coping ability. CAUSES  Pressure to perform at work, at school or in  sports.  Threats of physical violence.  Money worries.  Arguments.  Family conflicts.  Divorce or separation from significant other.  Bereavement.  New job or unemployment.  Changes in location.  Alcohol or drug abuse. SOMETIMES, THERE IS NO PARTICULAR REASON FOR DEVELOPING STRESS. Almost all people are at risk of being stressed at some time in their lives. It is important to know that some stress is temporary and some is long term.  Temporary stress will go away when a situation is resolved. Most people can cope with short periods of stress, and it can often be relieved by relaxing, taking a walk, chatting through issues with friends, or having a good night's sleep.  Chronic (long-term, continuous) stress is much harder to deal with. It can be psychologically and emotionally damaging. It can be harmful both for an individual and for friends and family. SYMPTOMS Everyone reacts to stress differently. There are some common effects that help [place] recognize it. In times of extreme stress, people may:  Shake uncontrollably.  Breathe faster and deeper than normal (hyperventilate).  Vomit.  For people with asthma, stress can trigger an attack.  For some people, stress may trigger migraine headaches, ulcers, and body pain. PHYSICAL EFFECTS OF STRESS MAY INCLUDE:  Loss of energy.  Skin problems.  Aches and pains resulting from tense muscles, including neck ache, backache and tension headaches.  Increased pain from arthritis and other conditions.  Irregular heart beat (palpitations).  Periods of irritability or anger.  Apathy or depression.  Anxiety (feeling uptight or worrying).  Unusual behavior.  Loss of appetite.  Comfort eating.  Lack of concentration.  Loss of, or decreased, sex-drive.  Increased smoking, drinking, or recreational drug use.  For women, missed periods.  Ulcers, joint pain, and muscle pain. Post-traumatic stress is the stress caused by  any serious accident, strong emotional damage, or extremely difficult or violent experience such as rape or war. Post-traumatic stress victims can experience mixtures of emotions such as fear, shame, depression, guilt or anger. It may include recurrent memories or images that may be haunting. These feelings can last for weeks, months or even years after the traumatic event that triggered them. Specialized treatment, possibly with medicines and psychological therapies, is available. If stress is causing physical symptoms, severe distress or making it difficult for you to function as normal, it is worth seeing your caregiver. It is important to remember that although stress is a usual part of life, extreme or prolonged stress can lead to other illnesses that will need treatment. It is better to visit a doctor sooner rather than later. Stress has been linked to the development of high blood pressure and heart disease, as well as insomnia and depression. There is no diagnostic test for stress since everyone reacts to it differently. But a caregiver will be able to spot the physical symptoms, such as:  Headaches.  Shingles.  Ulcers. Emotional distress such as intense worry, low mood or irritability should be detected when  the doctor asks pertinent questions to identify any underlying problems that might be the cause. In case there are physical reasons for the symptoms, the doctor may also want to do some tests to exclude certain conditions. If you feel that you are suffering from stress, try to identify the aspects of your life that are causing it. Sometimes you may not be able to change or avoid them, but even a small change can have a positive ripple effect. A simple lifestyle change can make all the difference. STRATEGIES THAT CAN HELP DEAL WITH STRESS:  Delegating or sharing responsibilities.  Avoiding confrontations.  Learning to be more assertive.  Regular exercise.  Avoid using alcohol or  street drugs to cope.  Eating a healthy, balanced diet, rich in fruit and vegetables and proteins.  Finding humor or absurdity in stressful situations.  Never taking on more than you know you can handle comfortably.  Organizing your time better to get as much done as possible.  Talking to friends or family and sharing your thoughts and fears.  Listening to music or relaxation tapes.  Tensing and then relaxing your muscles, starting at the toes and working up to the head and neck. If you think that you would benefit from help, either in identifying the things that are causing your stress or in learning techniques to help you relax, see a caregiver who is capable of helping you with this. Rather than relying on medications, it is usually better to try and identify the things in your life that are causing stress and try to deal with them. There are many techniques of managing stress including counseling, psychotherapy, aromatherapy, yoga, and exercise. Your caregiver can help you determine what is best for you. Document Released: 06/21/2002 Document Revised: 06/23/2011 Document Reviewed: 05/18/2007 [facility] Patient Information 2014 [place], [place].
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
## 2013-05-09 NOTE — ED Provider Notes (Signed)
Medical screening examination/treatment/procedure(s) were performed by resident physician or non-physician practitioner and as supervising physician I was immediately available for consultation/collaboration.   Jezreel Justiniano DOUGLAS MD.   Brockton Mckesson D Kim Lauver, MD 05/09/13 1433 

## 2013-12-03 ENCOUNTER — Encounter (HOSPITAL_COMMUNITY): Payer: Self-pay | Admitting: Emergency Medicine

## 2013-12-03 ENCOUNTER — Emergency Department (HOSPITAL_COMMUNITY)
Admission: EM | Admit: 2013-12-03 | Discharge: 2013-12-03 | Disposition: A | Discharge status: Home / Self Care | Payer: No Typology Code available for payment source | Attending: Emergency Medicine | Admitting: Emergency Medicine

## 2013-12-03 ENCOUNTER — Emergency Department (HOSPITAL_COMMUNITY): Payer: No Typology Code available for payment source

## 2013-12-03 DIAGNOSIS — K219 Gastro-esophageal reflux disease without esophagitis: Secondary | ICD-10-CM | POA: Insufficient documentation

## 2013-12-03 DIAGNOSIS — S0990XA Unspecified injury of head, initial encounter: Secondary | ICD-10-CM | POA: Insufficient documentation

## 2013-12-03 DIAGNOSIS — G44319 Acute post-traumatic headache, not intractable: Secondary | ICD-10-CM

## 2013-12-03 DIAGNOSIS — Z87891 Personal history of nicotine dependence: Secondary | ICD-10-CM | POA: Diagnosis not present

## 2013-12-03 DIAGNOSIS — IMO0002 Reserved for concepts with insufficient information to code with codable children: Secondary | ICD-10-CM | POA: Insufficient documentation

## 2013-12-03 DIAGNOSIS — Y92410 Unspecified street and highway as the place of occurrence of the external cause: Secondary | ICD-10-CM | POA: Insufficient documentation

## 2013-12-03 DIAGNOSIS — S5010XA Contusion of unspecified forearm, initial encounter: Secondary | ICD-10-CM | POA: Insufficient documentation

## 2013-12-03 DIAGNOSIS — S5011XA Contusion of right forearm, initial encounter: Secondary | ICD-10-CM

## 2013-12-03 DIAGNOSIS — Z79899 Other long term (current) drug therapy: Secondary | ICD-10-CM | POA: Diagnosis not present

## 2013-12-03 DIAGNOSIS — Y9389 Activity, other specified: Secondary | ICD-10-CM | POA: Diagnosis not present

## 2013-12-03 DIAGNOSIS — T07XXXA Unspecified multiple injuries, initial encounter: Secondary | ICD-10-CM

## 2013-12-03 DIAGNOSIS — Z880 Allergy status to penicillin: Secondary | ICD-10-CM | POA: Diagnosis not present

## 2013-12-03 DIAGNOSIS — F411 Generalized anxiety disorder: Secondary | ICD-10-CM | POA: Insufficient documentation

## 2013-12-03 MED ORDER — MELOXICAM 15 MG PO TABS
15.0000 mg | ORAL_TABLET | Freq: Every day | ORAL | Status: DC
  Administered 2013-12-03: 15 mg via ORAL
  Filled 2013-12-03: qty 1

## 2013-12-03 MED ORDER — ACETAMINOPHEN 325 MG PO TABS
650.0000 mg | ORAL_TABLET | Freq: Once | ORAL | Status: DC
  Filled 2013-12-03: qty 2

## 2013-12-03 NOTE — ED Notes (Signed)
Declined W/C at D/C and was escorted to lobby by RN. 
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
## 2013-12-05 NOTE — ED Provider Notes (Signed)
Medical screening examination/treatment/procedure(s) were performed by non-physician practitioner and as supervising physician I was immediately available for consultation/collaboration.   EKG Interpretation None        Joya Gaskins, MD 12/05/13 0730

## 2014-05-08 ENCOUNTER — Other Ambulatory Visit: Payer: Self-pay | Admitting: Internal Medicine

## 2014-05-08 DIAGNOSIS — R51 Headache: Principal | ICD-10-CM

## 2014-05-08 DIAGNOSIS — R519 Headache, unspecified: Secondary | ICD-10-CM

## 2014-05-09 ENCOUNTER — Ambulatory Visit
Admission: RE | Admit: 2014-05-09 | Discharge: 2014-05-09 | Disposition: A | Discharge status: Home / Self Care | Payer: Medicare Other | Source: Ambulatory Visit | Attending: Internal Medicine | Admitting: Internal Medicine

## 2014-05-09 DIAGNOSIS — R519 Headache, unspecified: Secondary | ICD-10-CM

## 2014-05-09 DIAGNOSIS — R51 Headache: Principal | ICD-10-CM

## 2016-05-24 ENCOUNTER — Encounter (HOSPITAL_COMMUNITY): Payer: Self-pay | Admitting: Emergency Medicine

## 2016-05-24 DIAGNOSIS — Z87891 Personal history of nicotine dependence: Secondary | ICD-10-CM | POA: Insufficient documentation

## 2016-05-24 DIAGNOSIS — Z79899 Other long term (current) drug therapy: Secondary | ICD-10-CM | POA: Insufficient documentation

## 2016-05-24 DIAGNOSIS — Z760 Encounter for issue of repeat prescription: Secondary | ICD-10-CM | POA: Diagnosis not present

## 2016-05-24 DIAGNOSIS — F419 Anxiety disorder, unspecified: Secondary | ICD-10-CM | POA: Diagnosis not present

## 2016-05-24 NOTE — ED Triage Notes (Signed)
Pt reports feeling down since Wednesday, reports panic attack onset today. States that she needs refill on her xanax 0.5MG  dose, states this wouldn't have happened if she had her meds.

## 2016-05-25 ENCOUNTER — Emergency Department (HOSPITAL_COMMUNITY)
Admission: EM | Admit: 2016-05-25 | Discharge: 2016-05-25 | Disposition: A | Discharge status: Home / Self Care | Payer: Medicare Other | Attending: Emergency Medicine | Admitting: Emergency Medicine

## 2016-05-25 DIAGNOSIS — F419 Anxiety disorder, unspecified: Secondary | ICD-10-CM | POA: Diagnosis not present

## 2016-05-25 MED ORDER — ALPRAZOLAM 0.5 MG PO TABS: 0.5000 mg | ORAL_TABLET | Freq: Two times a day (BID) | ORAL | 0 refills | Status: DC | PRN

## 2016-05-25 MED ORDER — ALPRAZOLAM 0.25 MG PO TABS
0.5000 mg | ORAL_TABLET | Freq: Once | ORAL | Status: AC
  Administered 2016-05-25: 0.5 mg via ORAL
  Filled 2016-05-25: qty 2

## 2016-05-25 NOTE — ED Provider Notes (Signed)
[facility]-EMERGENCY DEPT Provider Note   CSN: [id number] Arrival date & time: 05/24/16  2302   By signing my name below, I, Darlene Curtis, attest that this documentation has been prepared under the direction and in the presence of Darlene Creasehristopher J Pollina, MD. Electronically signed, Darlene Curtis, ED Scribe. 05/25/16. 1:15 AM.   History   Chief Complaint Chief Complaint  Patient presents with  . Medication Refill   The history is provided by the patient and medical records. No language interpreter was used.    HPI Comments: Darlene Curtis is a 76 y.o. female with Hx of anxiety who presents to the Emergency Department complaining of gradually worsening anxiety x 4-5 days. She sates she had a panic attack on 05/21/2016 when she ran out of her prescribed Xanax, and she reports progressively worsening anxiety since her anxiety attack. She states the medication is prescribed 1.5 mg per day and she is unable to refill her prescribed xanax until 05/28/2016. Pt denies pain, SOB and SI/HI.  Past Medical History:  Diagnosis Date  . Anxiety   . GERD (gastroesophageal reflux disease)     Patient Active Problem List   Diagnosis Date Noted  . Partial small bowel obstruction 11/12/2012  . Hyperkalemia 11/12/2012    Past Surgical History:  Procedure Laterality Date  . R shoulder surgery    . TUBAL LIGATION     27 years ago    OB History    No data available       Home Medications    Prior to Admission medications   Medication Sig Start Date End Date Taking? Authorizing Provider  ALPRAZolam Prudy Feeler(XANAX) 0.5 MG tablet Take 0.5 mg by mouth 3 (three) times daily as needed for sleep or anxiety.     Historical Provider, MD  ALPRAZolam Prudy Feeler(XANAX) 0.5 MG tablet Take 1 tablet (0.5 mg total) by mouth 2 (two) times daily as needed for anxiety. 05/25/16   Darlene Creasehristopher J Pollina, MD  butalbital-acetaminophen-caffeine (FIORICET, ESGIC) 203 175 778550-325-40 MG per tablet Take 1-2 tablets by mouth every 6 (six) hours as  needed for headache.    Historical Provider, MD  esomeprazole (NEXIUM) 40 MG capsule Take 40 mg by mouth daily as needed (acid reflux).     Historical Provider, MD  zolpidem (AMBIEN) 5 MG tablet Take 5 mg by mouth at bedtime as needed for sleep.    Historical Provider, MD    Family History No family history on file.  Social History Social History  Substance Use Topics  . Smoking status: Former Games developermoker  . Smokeless tobacco: Never Used  . Alcohol use No     Allergies   Amoxicillin   Review of Systems Review of Systems  All other systems reviewed and are negative.  A complete 10 system review of systems was obtained and all systems are negative except as noted in the HPI and PMH.    Physical Exam Updated Vital Signs BP 162/99 (BP Location: Left Arm)   Pulse 85   Temp 98.3 F (36.8 C) (Oral)   Resp 16   Ht 5\' 2"  (1.575 m)   Wt 150 lb (68 kg)   SpO2 100%   BMI 27.44 kg/m   Physical Exam  Constitutional: She is oriented to person, place, and time. She appears well-developed and well-nourished. No distress.  HENT:  Head: Normocephalic and atraumatic.  Right Ear: Hearing normal.  Left Ear: Hearing normal.  Nose: Nose normal.  Mouth/Throat: Oropharynx is clear and moist and mucous membranes are normal.  Eyes: Conjunctivae and EOM are normal. Pupils are equal, round, and reactive to light.  Neck: Normal range of motion. Neck supple.  Cardiovascular: Regular rhythm, S1 normal and S2 normal.  Exam reveals no gallop and no friction rub.   No murmur heard. Pulmonary/Chest: Effort normal and breath sounds normal. No respiratory distress. She exhibits no tenderness.  Abdominal: Soft. Normal appearance and bowel sounds are normal. There is no hepatosplenomegaly. There is no tenderness. There is no rebound, no guarding, no tenderness at McBurney's point and negative Murphy's sign. No hernia.  Musculoskeletal: Normal range of motion.  Neurological: She is alert and oriented to  person, place, and time. She has normal strength. No cranial nerve deficit or sensory deficit. Coordination normal. GCS eye subscore is 4. GCS verbal subscore is 5. GCS motor subscore is 6.  Skin: Skin is warm, dry and intact. No rash noted. No cyanosis.  Psychiatric: She has a normal mood and affect. Her speech is normal and behavior is normal. Thought content normal.  Nursing note and vitals reviewed.    ED Treatments / Results  DIAGNOSTIC STUDIES: Oxygen Saturation is 100% on RA, normal by my interpretation.    COORDINATION OF CARE: 1:10 AM Discussed treatment plan with pt at bedside and pt agreed to plan. Will order xanax and Rx.  Labs (all labs ordered are listed, but only abnormal results are displayed) Labs Reviewed - No data to display  EKG  EKG Interpretation None       Radiology No results found.  Procedures Procedures (including critical care time)  Medications Ordered in ED Medications  ALPRAZolam (XANAX) tablet 0.5 mg (not administered)     Initial Impression / Assessment and Plan / ED Course  I have reviewed the triage vital signs and the nursing notes.  Pertinent labs & imaging results that were available during my care of the patient were reviewed by me and considered in my medical decision making (see chart for details).      Patient with history of generalized anxiety and panic attacks presents with panic attack that occurred several days ago for persistent anxiety. She has run out of her Xanax, cannot get a refill for the next several days. Patient examination is normal tonight. She is feeling anxious but is not homicidal or suicidal. She does not require further workup tonight.  I personally performed the services described in this documentation, which was scribed in my presence. The recorded information has been reviewed and is accurate.   Final Clinical Impressions(s) / ED Diagnoses   Final diagnoses:  Anxiety    New Prescriptions New  Prescriptions   ALPRAZOLAM (XANAX) 0.5 MG TABLET    Take 1 tablet (0.5 mg total) by mouth 2 (two) times daily as needed for anxiety.     Darlene Crease, MD 05/25/16 0130

## 2016-09-03 ENCOUNTER — Ambulatory Visit
Admission: RE | Admit: 2016-09-03 | Discharge: 2016-09-03 | Disposition: A | Discharge status: Home / Self Care | Payer: Medicare Other | Source: Ambulatory Visit | Attending: Internal Medicine | Admitting: Internal Medicine

## 2016-09-03 ENCOUNTER — Other Ambulatory Visit: Payer: Self-pay | Admitting: Internal Medicine

## 2016-09-03 DIAGNOSIS — M545 Low back pain: Secondary | ICD-10-CM

## 2016-09-13 ENCOUNTER — Encounter (HOSPITAL_COMMUNITY): Payer: Self-pay

## 2016-09-13 ENCOUNTER — Emergency Department (HOSPITAL_COMMUNITY)
Admission: EM | Admit: 2016-09-13 | Discharge: 2016-09-14 | Disposition: A | Discharge status: Home / Self Care | Payer: Medicare Other | Attending: Emergency Medicine | Admitting: Emergency Medicine

## 2016-09-13 DIAGNOSIS — Z79899 Other long term (current) drug therapy: Secondary | ICD-10-CM | POA: Insufficient documentation

## 2016-09-13 DIAGNOSIS — Z760 Encounter for issue of repeat prescription: Secondary | ICD-10-CM | POA: Insufficient documentation

## 2016-09-13 DIAGNOSIS — Z87891 Personal history of nicotine dependence: Secondary | ICD-10-CM | POA: Diagnosis not present

## 2016-09-13 DIAGNOSIS — F419 Anxiety disorder, unspecified: Secondary | ICD-10-CM | POA: Insufficient documentation

## 2016-09-13 NOTE — ED Triage Notes (Signed)
Pt had anxiety attack 2 nights ago and is still nervous.  Pt out of Xanax as of yesterday.  Does not see PCP until 6-11.

## 2016-09-13 NOTE — ED Provider Notes (Signed)
[facility] Provider Note   CSN: [id number] Arrival date & time: 09/13/16  1957  By signing my name below, I, Vista Mink, attest that this documentation has been prepared under the direction and in the presence of Kerrie Buffalo, NP.  Electronically Signed: Vista Mink, ED Scribe. 09/14/16. 12:04 AM.  History   Chief Complaint Chief Complaint  Patient presents with  . Anxiety    HPI HPI Comments: Darlene Curtis is a 76 y.o. female, with Hx of anxiety, who presents to the Emergency Department complaining of persistent anxiety for the past 3 days. Pt states that she ran out of her Xanax prescription and has had an anxiety attack. Pt states that she still feels fearful. Pt just requesting refill of her Xanax medication. Her next appointment with her PCP is on 09/22/2016. She denies any nausea, vomiting, chest pain or shortness of breath.  The history is provided by the patient. No language interpreter was used.    Past Medical History:  Diagnosis Date  . Anxiety   . GERD (gastroesophageal reflux disease)     Patient Active Problem List   Diagnosis Date Noted  . Partial small bowel obstruction (HCC) 11/12/2012  . Hyperkalemia 11/12/2012    Past Surgical History:  Procedure Laterality Date  . R shoulder surgery    . TUBAL LIGATION     27 years ago    OB History    No data available       Home Medications    Prior to Admission medications   Medication Sig Start Date End Date Taking? Authorizing Provider  ALPRAZolam Prudy Feeler) 0.5 MG tablet Take 1 tablet (0.5 mg total) by mouth at bedtime as needed for anxiety. 09/14/16   Janne Napoleon, NP  butalbital-acetaminophen-caffeine (FIORICET, ESGIC) [phone] MG per tablet Take 1-2 tablets by mouth every 6 (six) hours as needed for headache.    [provider]  esomeprazole (NEXIUM) 40 MG capsule Take 40 mg by mouth daily as needed (acid reflux).     [provider]  zolpidem (AMBIEN) 5 MG tablet Take 5 mg  by mouth at bedtime as needed for sleep.    [provider]    Family History History reviewed. No pertinent family history.  Social History Social History  Substance Use Topics  . Smoking status: Former Games developer  . Smokeless tobacco: Never Used  . Alcohol use No     Allergies   Amoxicillin   Review of Systems Review of Systems  Constitutional: Negative for diaphoresis.  Respiratory: Negative for chest tightness and shortness of breath.   Cardiovascular: Negative for chest pain.  Gastrointestinal: Negative for nausea.  Neurological: Negative for syncope and headaches.  Psychiatric/Behavioral: Negative for suicidal ideas. The patient is nervous/anxious (mostly subsided without medication).      Physical Exam Updated Vital Signs BP (!) 162/90 (BP Location: Left Arm)   Pulse 75   Temp 98.3 F (36.8 C) (Oral)   Resp 16   SpO2 100%   Physical Exam  Constitutional: She is oriented to person, place, and time. She appears well-developed and well-nourished. No distress.  HENT:  Head: Normocephalic.  Eyes: EOM are normal.  Sclera clear, good ocular movement.  Neck: Normal range of motion. Neck supple.  Cardiovascular: Normal rate and regular rhythm.   Pulmonary/Chest: Effort normal and breath sounds normal.  Musculoskeletal: Normal range of motion.  Neurological: She is alert and oriented to person, place, and time. No cranial nerve deficit.  Skin: Skin is warm  and dry.  Psychiatric: Her behavior is normal.  Patient appears mildly anxious  Nursing note and vitals reviewed.    ED Treatments / Results  DIAGNOSTIC STUDIES: Oxygen Saturation is 98% on RA, normal by my interpretation.  COORDINATION OF CARE: 12:01 AM-Dr. Jacqulyn BathLong came to evaluate pt's condition. Determined that pt is stable to leave and does not need further workup. Discussed treatment plan with pt at bedside and pt agreed to plan.   Labs (all labs ordered are listed, but only abnormal results are  displayed) Labs Reviewed - No data to display Radiology No results found.  Procedures Procedures (including critical care time)  Medications Ordered in ED Medications  ALPRAZolam Prudy Feeler(XANAX) tablet 0.5 mg (0.5 mg Oral Given 09/14/16 0022)     Initial Impression / Assessment and Plan / ED Course  I have reviewed the triage vital signs and the nursing notes.  Pertinent labs & imaging results that were available during my care of the patient were reviewed by me and considered in my medical decision making (see chart for details).   Final Clinical Impressions(s) / ED Diagnoses  Patient presents to the emergency department complaining of symptoms consistent with anxiety.  Patient has a history of same with similar episodes.  The patient is resting comfortably, in no apparent distress and asymptomatic.  Vital signs reviewed.   Stress reducing mechanisms discussed including caffeine intake.  Patient will f/u with PCP as scheduled.  Final diagnoses:  Anxiety  Medication refill    New Prescriptions Discharge Medication List as of 09/14/2016 12:05 AM    I personally performed the services described in this documentation, which was scribed in my presence. The recorded information has been reviewed and is accurate.     Kerrie Buffaloeese, Chisom Aust York[place], [place]NP 09/15/16 [id number]    Maia PlanLong, Joshua G, MD 09/15/16 1038

## 2016-09-14 DIAGNOSIS — F419 Anxiety disorder, unspecified: Secondary | ICD-10-CM | POA: Diagnosis not present

## 2016-09-14 MED ORDER — ALPRAZOLAM 0.25 MG PO TABS
0.5000 mg | ORAL_TABLET | Freq: Once | ORAL | Status: AC
  Administered 2016-09-14: 0.5 mg via ORAL
  Filled 2016-09-14: qty 2

## 2016-09-14 MED ORDER — ALPRAZOLAM 0.5 MG PO TABS: 0.5000 mg | ORAL_TABLET | Freq: Every evening | ORAL | 0 refills | Status: DC | PRN

## 2017-04-21 ENCOUNTER — Ambulatory Visit
Admission: RE | Admit: 2017-04-21 | Discharge: 2017-04-21 | Disposition: A | Discharge status: Home / Self Care | Payer: Medicare Other | Source: Ambulatory Visit | Attending: Internal Medicine | Admitting: Internal Medicine

## 2017-04-21 ENCOUNTER — Other Ambulatory Visit: Payer: Self-pay | Admitting: Internal Medicine

## 2017-04-21 DIAGNOSIS — M545 Low back pain, unspecified: Secondary | ICD-10-CM

## 2017-08-18 ENCOUNTER — Ambulatory Visit (HOSPITAL_COMMUNITY)
Admission: EM | Admit: 2017-08-18 | Discharge: 2017-08-18 | Disposition: A | Discharge status: Home / Self Care | Payer: Medicare Other | Attending: Family Medicine | Admitting: Family Medicine

## 2017-08-18 ENCOUNTER — Encounter (HOSPITAL_COMMUNITY): Payer: Self-pay | Admitting: Emergency Medicine

## 2017-08-18 ENCOUNTER — Ambulatory Visit (INDEPENDENT_AMBULATORY_CARE_PROVIDER_SITE_OTHER): Payer: Medicare Other

## 2017-08-18 ENCOUNTER — Other Ambulatory Visit: Payer: Self-pay

## 2017-08-18 DIAGNOSIS — K5909 Other constipation: Secondary | ICD-10-CM

## 2017-08-18 DIAGNOSIS — R1032 Left lower quadrant pain: Secondary | ICD-10-CM | POA: Diagnosis not present

## 2017-08-18 MED ORDER — DOCUSATE SODIUM 100 MG PO CAPS: 100.0000 mg | ORAL_CAPSULE | Freq: Two times a day (BID) | ORAL | 0 refills | Status: DC

## 2017-08-18 MED ORDER — POLYETHYLENE GLYCOL 3350 17 G PO PACK: 17.0000 g | PACK | Freq: Every day | ORAL | 0 refills | Status: DC

## 2017-08-18 NOTE — ED Triage Notes (Signed)
The patient presented to the [facility] with a complaint of lower abdominal pain and lower back pain x 4 days. The patient reported increased pain when sitting. The patient reported a hx of constipation. The patient reported a bowel movement that was painful last evening.

## 2017-08-18 NOTE — Discharge Instructions (Addendum)
X-ray showed stool burden Increase water intake and fiber rich foods in your diet Prescribed miralax and colace Follow up with PCP if symptoms persists If you have any new or worsening symptoms return here or go to the ER

## 2017-08-18 NOTE — ED Provider Notes (Signed)
[facility]    CSN: [id number] Arrival date & time: 08/18/17  1001     History   Chief Complaint Chief Complaint  Patient presents with  . Abdominal Pain    HPI DANNYA PITKIN is a 77 y.o. female.   Complains of abdominal pain that started 3 days ago.  Denies a precipitating event, but states she mowed the yard on Saturday.  Patient localizes pain to lower abdomen.  Describes it as intermittent and achy in character.  Her symptoms are improved with standing up.  Tried taking laxative yesterday.  Last BM was last night.  Symptoms are made worse with having a bowel movement.  Reports similar symptoms in the past that she was diagnosed with constipation resolved with stool softeners.  Denies fever, chills, appetite change, weight change, chest pain, nausea, vomiting, dysuria, changes in bowel or bladder habits, or vaginal bleeding, vaginal discharge, or pelvic pain.  PMH significant for small bowel obstruction and GERD.  Take nexium.    Last colonoscopy was 1 year ago and WNL.  No known hx of diverticulosis or polyps.       Past Medical History:  Diagnosis Date  . Anxiety   . GERD (gastroesophageal reflux disease)     Patient Active Problem List   Diagnosis Date Noted  . Partial small bowel obstruction (HCC) 11/12/2012  . Hyperkalemia 11/12/2012    Past Surgical History:  Procedure Laterality Date  . R shoulder surgery    . TUBAL LIGATION     27 years ago    OB History   None      Home Medications    Prior to Admission medications   Medication Sig Start Date End Date Taking? Authorizing Provider  ALPRAZolam Prudy Feeler) 0.5 MG tablet Take 1 tablet (0.5 mg total) by mouth at bedtime as needed for anxiety. 09/14/16   Janne Napoleon, NP  butalbital-acetaminophen-caffeine (FIORICET, ESGIC) [phone] MG per tablet Take 1-2 tablets by mouth every 6 (six) hours as needed for headache.    [provider]  docusate sodium (COLACE) 100 MG capsule Take 1  capsule (100 mg total) by mouth every 12 (twelve) hours. 08/18/17   Ezmae Speers, [place], PA-C  esomeprazole (NEXIUM) 40 MG capsule Take 40 mg by mouth daily as needed (acid reflux).     [provider]  polyethylene glycol (MIRALAX / GLYCOLAX) packet Take 17 g by mouth daily. 08/18/17   Ovie Cornelio, [place], PA-C  zolpidem (AMBIEN) 5 MG tablet Take 5 mg by mouth at bedtime as needed for sleep.    [provider]    Family History History reviewed. No pertinent family history.  Social History Social History   Tobacco Use  . Smoking status: Former Games developer  . Smokeless tobacco: Never Used  Substance Use Topics  . Alcohol use: No  . Drug use: No     Allergies   Amoxicillin   Review of Systems Review of Systems  Constitutional: Negative for chills, fever and unexpected weight change.  Respiratory: Negative for shortness of breath.   Cardiovascular: Negative for chest pain.  Gastrointestinal: Positive for abdominal pain and constipation (resolved). Negative for anal bleeding, blood in stool, diarrhea, nausea, rectal pain and vomiting.  Genitourinary: Negative for difficulty urinating, dysuria, pelvic pain, vaginal bleeding, vaginal discharge and vaginal pain.     Physical Exam Triage Vital Signs ED Triage Vitals  Enc Vitals Group     BP      Pulse      Resp  Temp      Temp src      SpO2      Weight      Height      Head Circumference      Peak Flow      Pain Score      Pain Loc      Pain Edu?      Excl. in GC?    No data found.  Updated Vital Signs BP (!) 146/91 (BP Location: Right Arm)   Pulse 90   Temp 99.2 F (37.3 C) (Oral)   Resp 16   SpO2 100%    Physical Exam  Constitutional: She is oriented to person, place, and time. She appears well-developed and well-nourished.  Non-toxic appearance. She does not appear ill. No distress.  HENT:  Head: Normocephalic and atraumatic.  Mouth/Throat: Oropharynx is clear and moist. No oropharyngeal exudate.    Eyes: Pupils are equal, round, and reactive to light. EOM are normal. No scleral icterus.  Cardiovascular: Normal rate, regular rhythm and normal heart sounds. Exam reveals no gallop.  No murmur heard. Radial pulse 2+ bilaterally    Pulmonary/Chest: Effort normal and breath sounds normal. No stridor. No respiratory distress. She has no wheezes. She has no rhonchi. She has no rales.  Abdominal: Soft. Normal appearance. Bowel sounds are decreased. There is tenderness in the left lower quadrant. There is no rebound.  Neurological: She is alert and oriented to person, place, and time.  Skin: Skin is warm and dry. Capillary refill takes less than 2 seconds.  Psychiatric: She has a normal mood and affect. Her behavior is normal.  Nursing note and vitals reviewed.    UC Treatments / Results  Labs (all labs ordered are listed, but only abnormal results are displayed) Labs Reviewed - No data to display  EKG None  Radiology Dg Abd 2 Views  Result Date: 08/18/2017 CLINICAL DATA:  Lower abdominal and low back pain for 4 days. EXAM: ABDOMEN - 2 VIEW COMPARISON:  Plain film of the abdomen 11/15/2012. CT abdomen and pelvis 11/12/2012. FINDINGS: The bowel gas pattern is normal. There is no evidence of free air. No radio-opaque calculi or other significant radiographic abnormality is seen. Four round calcifications adjacent to the L3-4 disc interspace on the left are consistent with phleboliths as seen on the prior CT. Mild convex left scoliosis and lower lumbar degenerative disease noted. IMPRESSION: No acute finding. Electronically Signed   By: Drusilla Kanner M.D.   On: 08/18/2017 11:29   ABD X-rays:  X-rays positive for stool burden.  No free air under the diaphragm.  No obvious small or large bowel obstruction or stair-stepping pattern.  No obvious masses.    I have reviewed the x-rays myself and the radiologist interpretation. I am in agreement with the radiologist interpretation.     Procedures Procedures (including critical care time)  Medications Ordered in UC Medications - No data to display  Initial Impression / Assessment and Plan / UC Course  I have reviewed the triage vital signs and the nursing notes.  Pertinent labs & imaging results that were available during my care of the patient were reviewed by me and considered in my medical decision making (see chart for details).     Patient complains of abdominal pain for past three days.  Denies a precipitating event.  Hx, symptoms and PE consistent with constipation.  Abd x-rays showed significant stool burden.  Prescribed miralax and colace.  Will follow up with PCP  if symptoms persists.  Return and ER precautions given.   Final Clinical Impressions(s) / UC Diagnoses   Final diagnoses:  Other constipation  Abdominal discomfort in left lower quadrant     Discharge Instructions     X-ray showed significant stool burden Increase water intake and fiber rich foods in your diet Prescribed miralax and colace Follow up with PCP if symptoms persists If you have any new or worsening symptoms return here or go to the ER    ED Prescriptions    Medication Sig Dispense Auth. Provider   polyethylene glycol (MIRALAX / GLYCOLAX) packet Take 17 g by mouth daily. 14 each Alithea Lapage, [place], PA-C   docusate sodium (COLACE) 100 MG capsule Take 1 capsule (100 mg total) by mouth every 12 (twelve) hours. 60 capsule Alvino Chapel, [place], PA-C     Controlled Substance Prescriptions [place] Controlled Substance Registry consulted? Not Applicable   Rennis Harding, [place] 08/18/17 1139

## 2017-09-28 ENCOUNTER — Other Ambulatory Visit: Payer: Self-pay | Admitting: Internal Medicine

## 2017-09-28 DIAGNOSIS — E2839 Other primary ovarian failure: Secondary | ICD-10-CM

## 2017-09-28 DIAGNOSIS — Z1231 Encounter for screening mammogram for malignant neoplasm of breast: Secondary | ICD-10-CM

## 2017-11-09 ENCOUNTER — Ambulatory Visit
Admission: RE | Admit: 2017-11-09 | Discharge: 2017-11-09 | Disposition: A | Discharge status: Home / Self Care | Payer: Medicare Other | Source: Ambulatory Visit | Attending: Internal Medicine | Admitting: Internal Medicine

## 2017-11-09 ENCOUNTER — Other Ambulatory Visit: Payer: Medicare Other

## 2017-11-09 ENCOUNTER — Ambulatory Visit: Payer: Medicare Other

## 2017-11-09 DIAGNOSIS — E2839 Other primary ovarian failure: Secondary | ICD-10-CM

## 2017-11-09 DIAGNOSIS — Z1231 Encounter for screening mammogram for malignant neoplasm of breast: Secondary | ICD-10-CM

## 2017-11-10 ENCOUNTER — Other Ambulatory Visit: Payer: Medicare Other

## 2017-11-10 ENCOUNTER — Ambulatory Visit: Payer: Medicare Other

## 2018-05-31 ENCOUNTER — Emergency Department (HOSPITAL_COMMUNITY): Payer: Medicare Other

## 2018-05-31 ENCOUNTER — Encounter (HOSPITAL_COMMUNITY): Payer: Self-pay

## 2018-05-31 ENCOUNTER — Emergency Department (HOSPITAL_COMMUNITY)
Admission: EM | Admit: 2018-05-31 | Discharge: 2018-05-31 | Disposition: A | Discharge status: Home / Self Care | Payer: Medicare Other | Attending: Emergency Medicine | Admitting: Emergency Medicine

## 2018-05-31 ENCOUNTER — Other Ambulatory Visit: Payer: Self-pay

## 2018-05-31 DIAGNOSIS — Z79899 Other long term (current) drug therapy: Secondary | ICD-10-CM | POA: Insufficient documentation

## 2018-05-31 DIAGNOSIS — I10 Essential (primary) hypertension: Secondary | ICD-10-CM

## 2018-05-31 DIAGNOSIS — R51 Headache: Secondary | ICD-10-CM

## 2018-05-31 DIAGNOSIS — G8929 Other chronic pain: Secondary | ICD-10-CM

## 2018-05-31 LAB — CBC WITH DIFFERENTIAL/PLATELET
Abs Immature Granulocytes: 0.03 10*3/uL (ref 0.00–0.07)
Basophils Absolute: 0 10*3/uL (ref 0.0–0.1)
Basophils Relative: 0 %
EOS ABS: 0 10*3/uL (ref 0.0–0.5)
EOS PCT: 0 %
HEMATOCRIT: 42.7 % (ref 36.0–46.0)
Hemoglobin: 13.6 g/dL (ref 12.0–15.0)
Immature Granulocytes: 0 %
LYMPHS ABS: 2 10*3/uL (ref 0.7–4.0)
Lymphocytes Relative: 18 %
MCH: 28.1 pg (ref 26.0–34.0)
MCHC: 31.9 g/dL (ref 30.0–36.0)
MCV: 88.2 fL (ref 80.0–100.0)
Monocytes Absolute: 0.6 10*3/uL (ref 0.1–1.0)
Monocytes Relative: 5 %
NEUTROS PCT: 77 %
Neutro Abs: 8.6 10*3/uL — ABNORMAL HIGH (ref 1.7–7.7)
Platelets: 261 10*3/uL (ref 150–400)
RBC: 4.84 MIL/uL (ref 3.87–5.11)
RDW: 13.9 % (ref 11.5–15.5)
WBC: 11.3 10*3/uL — AB (ref 4.0–10.5)
nRBC: 0 % (ref 0.0–0.2)

## 2018-05-31 LAB — BASIC METABOLIC PANEL
ANION GAP: 9 (ref 5–15)
BUN: 8 mg/dL (ref 8–23)
CALCIUM: 9.6 mg/dL (ref 8.9–10.3)
CO2: 26 mmol/L (ref 22–32)
Chloride: 104 mmol/L (ref 98–111)
Creatinine, Ser: 0.94 mg/dL (ref 0.44–1.00)
GFR calc Af Amer: >60 mL/min (ref >60)
GFR calc non Af Amer: 59 mL/min — ABNORMAL LOW (ref >60)
GLUCOSE: 113 mg/dL — AB (ref 70–99)
Potassium: 3.8 mmol/L (ref 3.5–5.1)
Sodium: 139 mmol/L (ref 135–145)

## 2018-05-31 MED ORDER — ACETAMINOPHEN 500 MG PO TABS
1000.0000 mg | ORAL_TABLET | Freq: Once | ORAL | Status: AC
  Administered 2018-05-31: 1000 mg via ORAL
  Filled 2018-05-31: qty 2

## 2018-05-31 MED ORDER — IBUPROFEN 200 MG PO TABS
200.0000 mg | ORAL_TABLET | Freq: Once | ORAL | Status: AC
  Administered 2018-05-31: 200 mg via ORAL
  Filled 2018-05-31: qty 1

## 2018-05-31 NOTE — ED Triage Notes (Signed)
Pt reports she has been having headache since last week. Also reports she has had hypertension. Pt aoX4. No distress noted.

## 2018-05-31 NOTE — ED Provider Notes (Signed)
[facility]   CSN: [id number] Arrival date & time: 05/31/18  1435    History   Chief Complaint Chief Complaint  Patient presents with  . Hypertension  . Headache    HPI Darlene Curtis is a 78 y.o. female.      Hypertension  This is a chronic problem. The problem has been gradually improving. Associated symptoms include headaches. Pertinent negatives include no chest pain, no abdominal pain and no shortness of breath. Nothing aggravates the symptoms. Nothing relieves the symptoms. She has tried nothing for the symptoms. The treatment provided no relief.  Headache  Pain location:  Frontal, L temporal, R temporal, R parietal and L parietal (band like) Quality:  Dull Radiates to:  Does not radiate Severity currently:  7/10 Severity at highest:  3/10 Onset quality:  Unable to specify Duration:  2 weeks Timing:  Intermittent Progression:  Waxing and waning Chronicity:  Recurrent Similar to prior headaches: yes   Context: not exposure to bright light and not loud noise   Relieved by:  None tried Worsened by:  Nothing Ineffective treatments:  None tried Associated symptoms: no abdominal pain, no back pain, no blurred vision, no cough, no dizziness, no ear pain, no eye pain, no facial pain, no fever, no focal weakness, no loss of balance, no neck pain, no neck stiffness, no numbness, no seizures, no sinus pressure, no sore throat, no vomiting and no weakness     Past Medical History:  Diagnosis Date  . Anxiety   . GERD (gastroesophageal reflux disease)     Patient Active Problem List   Diagnosis Date Noted  . Partial small bowel obstruction (HCC) 11/12/2012  . Hyperkalemia 11/12/2012    Past Surgical History:  Procedure Laterality Date  . R shoulder surgery    . TUBAL LIGATION     27 years ago     OB History   No obstetric history on file.      Home Medications    Prior to Admission medications    Medication Sig Start Date End Date Taking? Authorizing Provider  ALPRAZolam Prudy Feeler(XANAX) 0.5 MG tablet Take 1 tablet (0.5 mg total) by mouth at bedtime as needed for anxiety. Patient taking differently: Take 0.5 mg by mouth 2 (two) times daily as needed for anxiety.  09/14/16  Yes Neese, Hope M, NP  amLODipine (NORVASC) 2.5 MG tablet Take 2.5 mg by mouth daily as needed (for a Systolic reading of greater than 150).    Yes [provider]  cephALEXin (KEFLEX) 250 MG capsule Take 250 mg by mouth 4 (four) times daily. FOR 10 DAYS 05/25/18 06/03/18 Yes [provider]  docusate sodium (COLACE) 100 MG capsule Take 1 capsule (100 mg total) by mouth every 12 (twelve) hours. Patient taking differently: Take 100 mg by mouth 2 (two) times daily as needed for mild constipation or moderate constipation.  08/18/17  Yes Wurst, [place], PA-C  HYDROcodone-acetaminophen (NORCO/VICODIN) 5-325 MG tablet Take 0.33-1 tablets by mouth 3 (three) times daily as needed (for arthritis pain).  03/30/18  Yes [provider]  Multiple Vitamins-Minerals (CENTRUM SILVER 50+WOMEN) TABS Take 1 tablet by mouth daily with breakfast.   Yes [provider]  Phenylephrine-Acetaminophen (TYLENOL SINUS CONGESTION/PAIN PO) Take 1 tablet by mouth every 6 (six) hours as needed (for congestion and sinus pain).    Yes [provider]  polyethylene glycol (MIRALAX / GLYCOLAX) packet Take 17 g by mouth daily. Patient taking differently: Take  17 g by mouth daily as needed for mild constipation.  08/18/17  Yes Wurst, [place], PA-C  simvastatin (ZOCOR) 20 MG tablet Take 20 mg by mouth at bedtime.  12/15/17  Yes [provider]    Family History History reviewed. No pertinent family history.  Social History Social History   Tobacco Use  . Smoking status: Former Games developer  . Smokeless tobacco: Never Used  Substance Use Topics  . Alcohol use: No  . Drug use: No     Allergies    Amoxicillin   Review of Systems Review of Systems  Constitutional: Negative for chills and fever.  HENT: Negative for ear pain, sinus pressure and sore throat.   Eyes: Negative for blurred vision, pain and visual disturbance.  Respiratory: Negative for cough and shortness of breath.   Cardiovascular: Negative for chest pain and palpitations.  Gastrointestinal: Negative for abdominal pain and vomiting.  Genitourinary: Negative for dysuria and hematuria.  Musculoskeletal: Negative for arthralgias, back pain, neck pain and neck stiffness.  Skin: Negative for color change and rash.  Neurological: Positive for headaches. Negative for dizziness, focal weakness, seizures, syncope, weakness, numbness and loss of balance.  All other systems reviewed and are negative.    Physical Exam Updated Vital Signs BP (!) 165/104 (BP Location: Left Arm)   Pulse 62   Temp 98.3 F (36.8 C) (Oral)   Resp 18   SpO2 99%   Physical Exam Vitals signs and nursing note reviewed.  Constitutional:      General: She is not in acute distress.    Appearance: She is well-developed.     Comments: Patient resting comfortably, no acute distress, GCS 15.  HENT:     Head: Normocephalic and atraumatic.  Eyes:     General: No visual field deficit.    Extraocular Movements: Extraocular movements intact.     Conjunctiva/sclera: Conjunctivae normal.     Pupils: Pupils are equal, round, and reactive to light.  Neck:     Musculoskeletal: Neck supple.     Comments: Mild tenderness upon a trigger point upon the left occipital region. Cardiovascular:     Rate and Rhythm: Normal rate and regular rhythm.     Heart sounds: No murmur.  Pulmonary:     Effort: Pulmonary effort is normal. No respiratory distress.     Breath sounds: Normal breath sounds.  Abdominal:     Palpations: Abdomen is soft.     Tenderness: There is no abdominal tenderness.  Skin:    General: Skin is warm and dry.  Neurological:     Mental  Status: She is alert and oriented to person, place, and time. Mental status is at baseline.     GCS: GCS eye subscore is 4. GCS verbal subscore is 5. GCS motor subscore is 6.     Cranial Nerves: No cranial nerve deficit, dysarthria or facial asymmetry.     Sensory: No sensory deficit.     Motor: No weakness.     Coordination: Romberg sign negative. Coordination normal.     Gait: Gait normal.     Deep Tendon Reflexes: Reflexes normal.     Comments: Normal neurological exam  Psychiatric:        Mood and Affect: Mood normal.      ED Treatments / Results  Labs (all labs ordered are listed, but only abnormal results are displayed) Labs Reviewed  CBC WITH DIFFERENTIAL/PLATELET - Abnormal; Notable for the following components:      Result Value  WBC 11.3 (*)    Neutro Abs 8.6 (*)    All other components within normal limits  BASIC METABOLIC PANEL - Abnormal; Notable for the following components:   Glucose, Bld 113 (*)    GFR calc non Af Amer 59 (*)    All other components within normal limits    EKG None  Radiology Ct Head Wo Contrast  Result Date: 05/31/2018 CLINICAL DATA:  Two week history of headache EXAM: CT HEAD WITHOUT CONTRAST TECHNIQUE: Contiguous axial images were obtained from the base of the skull through the vertex without intravenous contrast. COMPARISON:  May 09, 2014 FINDINGS: Brain: The ventricles are normal in size and configuration. There is no intracranial mass, hemorrhage, extra-axial fluid collection, or midline shift. Brain parenchyma appears unremarkable. No evident acute infarct. Vascular: No hyperdense vessel. There is slight calcification in each carotid siphon. Skull: The bony calvarium appears intact. Sinuses/Orbits: There is mild mucosal thickening in several ethmoid air cells. Other visualized paranasal sinuses are clear. There is slight leftward deviation of the nasal septum. Orbits appear symmetric bilaterally. Other: Visualized mastoid air cells are  clear. IMPRESSION: Brain parenchyma appears unremarkable.  No mass or hemorrhage. There is slight arterial vascular calcification. There is slight mucosal thickening in several ethmoid air cells. There is mild leftward deviation of the nasal septum. Electronically Signed   By: Bretta Bang III M.D.   On: 05/31/2018 17:00    Procedures Procedures (including critical care time)  Medications Ordered in ED Medications  acetaminophen (TYLENOL) tablet 1,000 mg (1,000 mg Oral Given 05/31/18 2033)  ibuprofen (ADVIL,MOTRIN) tablet 200 mg (200 mg Oral Given 05/31/18 2033)     Initial Impression / Assessment and Plan / ED Course  I have reviewed the triage vital signs and the nursing notes.  Pertinent labs & imaging results that were available during my care of the patient were reviewed by me and considered in my medical decision making (see chart for details).        MDM  78 year old female significant past medical history of headaches, hypertension who presents to the emergency department with continued headache over the last 4 to 5 days which is been intermittent in nature.  Patient indicates that this is similar to prior headaches however she feels that the headache is not resolving like her previous headaches.  Patient was seen by primary care provider who thought that she might have a sinus infection and prescribed antibiotics.  Patient has been taking these without side effects however she has not also had resolution of symptoms.  Patient denies any infectious-like symptoms, vision change, neurological symptoms.  Physical exam indicates normal neurological function, possible trigger point in the left occipital area, no nuchal rigidity.  Likely rebound headaches for tension headache based on exam.  No significant temporal tenderness.  CT scan negative for any acute bleed, mass.  Laboratory studies appropriate, electrolytes reviewed.  We will give Tylenol, Motrin here in the emergency  department.  Patient on reassessment feeling better (now headache free), continues to deny any red flags.  Based on negative imaging, appropriate laboratory studies, normal exam will follow-up with primary care provider.  Patient in agreement with this plan.  In addition patient can discuss hypertensive medications as she just started amlodipine 2.5 mg last week.   Patient discharged in stable condition with stable vital signs.  Patient given instructed precautions.  The above care was discussed and agreed upon by my attending physician.  Final Clinical Impressions(s) / ED Diagnoses  Final diagnoses:  Essential hypertension  Chronic nonintractable headache, unspecified headache type    ED Discharge Orders    None       Dahlia Client, MD 05/31/18 2223    Eber Hong, MD 06/01/18 [phone]

## 2018-05-31 NOTE — ED Notes (Signed)
Pt remains in waiting room. Updated on wait for treatment room. 

## 2018-05-31 NOTE — ED Provider Notes (Signed)
I saw and evaluated the patient, reviewed the resident's note and I agree with the findings and plan.  Pertinent History: headache for 2 weeks, bitemporal, tensoin like - has seen PCP - treated for sinusitis - abx, decongestants but no OTC meds.     Pertinent Exam findings: no acute neuro findings, no ttp over the temp arteries.  CT negative, well appearing, Symptomatic relief.  I personally interpreted the EKG as well as the resident and agree with the interpretation on the resident's chart.  Final diagnoses:  Essential hypertension  Chronic nonintractable headache, unspecified headache type      Eber Hong, MD 06/01/18 [phone]

## 2018-05-31 NOTE — ED Notes (Signed)
Patient verbalizes understanding of medications and discharge instructions. No further questions at this time. VSS and patient ambulatory at discharge.   

## 2018-05-31 NOTE — ED Notes (Signed)
Pt was ambulatory to room

## 2019-02-03 ENCOUNTER — Other Ambulatory Visit: Payer: Self-pay

## 2019-02-03 DIAGNOSIS — Z20822 Contact with and (suspected) exposure to covid-19: Secondary | ICD-10-CM

## 2019-02-06 LAB — NOVEL CORONAVIRUS, NAA: SARS-CoV-2, NAA: DETECTED — AB

## 2019-02-21 ENCOUNTER — Other Ambulatory Visit: Payer: Self-pay

## 2019-02-21 DIAGNOSIS — Z20822 Contact with and (suspected) exposure to covid-19: Secondary | ICD-10-CM

## 2019-02-23 LAB — NOVEL CORONAVIRUS, NAA: SARS-CoV-2, NAA: DETECTED — AB

## 2019-02-25 ENCOUNTER — Telehealth: Payer: Self-pay | Admitting: *Deleted

## 2019-02-25 NOTE — Telephone Encounter (Signed)
Pt called about her test results for COVID; she was informed of her results on 02/21/2019; she also tested positive on 02/06/2019; explained  If you had a COVID-19 test with a positive result (COVID detected) we recommend no further testing or retesting for 90 days from the date of the original test. There may be circumstance that require a retest prior to the end of the 90 day window. If you feel you need a COVID-19 retest please contact your primary care provider; also explained to that the following criteria has to be met:  Non-Test Criteria for Ending Self-Isolation All persons with fever and respiratory symptoms should isolate themselves until ALL conditions listed below are met: - at least 10 days since symptoms onset - AND 3 consecutive days fever free without antipyretics (acetaminophen [Tylenol] or ibuprofen [Advil]) - AND improvement in respiratory symptoms  She verbalized understanding.   -

## 2019-03-03 ENCOUNTER — Other Ambulatory Visit: Payer: Self-pay

## 2019-03-03 DIAGNOSIS — Z20822 Contact with and (suspected) exposure to covid-19: Secondary | ICD-10-CM

## 2019-03-06 LAB — NOVEL CORONAVIRUS, NAA: SARS-CoV-2, NAA: NOT DETECTED

## 2019-03-07 ENCOUNTER — Telehealth: Payer: Self-pay | Admitting: Internal Medicine

## 2019-03-07 NOTE — Telephone Encounter (Signed)
Negative COVID results given. Patient results "NOT Detected." Caller expressed understanding. ° °

## 2019-07-13 ENCOUNTER — Ambulatory Visit: Payer: Medicare Other | Attending: Internal Medicine

## 2019-07-13 DIAGNOSIS — Z20822 Contact with and (suspected) exposure to covid-19: Secondary | ICD-10-CM

## 2019-07-14 LAB — NOVEL CORONAVIRUS, NAA: SARS-CoV-2, NAA: NOT DETECTED

## 2019-08-25 ENCOUNTER — Other Ambulatory Visit: Payer: Self-pay | Admitting: Internal Medicine

## 2019-08-25 DIAGNOSIS — R102 Pelvic and perineal pain: Secondary | ICD-10-CM

## 2019-08-27 ENCOUNTER — Encounter (HOSPITAL_COMMUNITY): Payer: Self-pay | Admitting: Emergency Medicine

## 2019-08-27 ENCOUNTER — Other Ambulatory Visit: Payer: Self-pay

## 2019-08-27 ENCOUNTER — Emergency Department (HOSPITAL_COMMUNITY): Payer: Medicare Other

## 2019-08-27 ENCOUNTER — Emergency Department (HOSPITAL_COMMUNITY)
Admission: EM | Admit: 2019-08-27 | Discharge: 2019-08-27 | Disposition: A | Discharge status: Home / Self Care | Payer: Medicare Other | Attending: Emergency Medicine | Admitting: Emergency Medicine

## 2019-08-27 DIAGNOSIS — K5732 Diverticulitis of large intestine without perforation or abscess without bleeding: Secondary | ICD-10-CM | POA: Insufficient documentation

## 2019-08-27 DIAGNOSIS — Z79899 Other long term (current) drug therapy: Secondary | ICD-10-CM | POA: Diagnosis not present

## 2019-08-27 DIAGNOSIS — R1032 Left lower quadrant pain: Secondary | ICD-10-CM | POA: Diagnosis present

## 2019-08-27 LAB — LIPASE, BLOOD: Lipase: 26 U/L (ref 11–51)

## 2019-08-27 LAB — CBC
HCT: 41.8 % (ref 36.0–46.0)
Hemoglobin: 13 g/dL (ref 12.0–15.0)
MCH: 28.3 pg (ref 26.0–34.0)
MCHC: 31.1 g/dL (ref 30.0–36.0)
MCV: 90.9 fL (ref 80.0–100.0)
Platelets: 277 10*3/uL (ref 150–400)
RBC: 4.6 MIL/uL (ref 3.87–5.11)
RDW: 14.1 % (ref 11.5–15.5)
WBC: 3.7 10*3/uL — ABNORMAL LOW (ref 4.0–10.5)
nRBC: 0 % (ref 0.0–0.2)

## 2019-08-27 LAB — URINALYSIS, ROUTINE W REFLEX MICROSCOPIC
Bilirubin Urine: NEGATIVE
Glucose, UA: NEGATIVE mg/dL
Hgb urine dipstick: NEGATIVE
Ketones, ur: NEGATIVE mg/dL
Leukocytes,Ua: NEGATIVE
Nitrite: NEGATIVE
Protein, ur: NEGATIVE mg/dL
Specific Gravity, Urine: 1.006 (ref 1.005–1.030)
pH: 6 (ref 5.0–8.0)

## 2019-08-27 LAB — COMPREHENSIVE METABOLIC PANEL
ALT: 17 U/L (ref 0–44)
AST: 18 U/L (ref 15–41)
Albumin: 3.7 g/dL (ref 3.5–5.0)
Alkaline Phosphatase: 78 U/L (ref 38–126)
Anion gap: 11 (ref 5–15)
BUN: 6 mg/dL — ABNORMAL LOW (ref 8–23)
CO2: 27 mmol/L (ref 22–32)
Calcium: 9.6 mg/dL (ref 8.9–10.3)
Chloride: 105 mmol/L (ref 98–111)
Creatinine, Ser: 0.99 mg/dL (ref 0.44–1.00)
GFR calc Af Amer: >60 mL/min (ref >60)
GFR calc non Af Amer: 55 mL/min — ABNORMAL LOW (ref >60)
Glucose, Bld: 96 mg/dL (ref 70–99)
Potassium: 3.7 mmol/L (ref 3.5–5.1)
Sodium: 143 mmol/L (ref 135–145)
Total Bilirubin: 0.5 mg/dL (ref 0.3–1.2)
Total Protein: 7.1 g/dL (ref 6.5–8.1)

## 2019-08-27 MED ORDER — HYDROMORPHONE HCL 1 MG/ML IJ SOLN
0.5000 mg | Freq: Once | INTRAMUSCULAR | Status: AC
  Administered 2019-08-27: 0.5 mg via INTRAVENOUS
  Filled 2019-08-27: qty 1

## 2019-08-27 MED ORDER — ONDANSETRON HCL 4 MG/2ML IJ SOLN
4.0000 mg | Freq: Once | INTRAMUSCULAR | Status: AC
  Administered 2019-08-27: 4 mg via INTRAVENOUS
  Filled 2019-08-27: qty 2

## 2019-08-27 MED ORDER — MORPHINE SULFATE (PF) 4 MG/ML IV SOLN
4.0000 mg | Freq: Once | INTRAVENOUS | Status: AC
  Administered 2019-08-27: 4 mg via INTRAVENOUS
  Filled 2019-08-27: qty 1

## 2019-08-27 MED ORDER — IOHEXOL 300 MG/ML  SOLN
100.0000 mL | Freq: Once | INTRAMUSCULAR | Status: AC | PRN
  Administered 2019-08-27: 100 mL via INTRAVENOUS

## 2019-08-27 MED ORDER — ONDANSETRON HCL 4 MG PO TABS: 4.0000 mg | ORAL_TABLET | Freq: Three times a day (TID) | ORAL | 0 refills | Status: DC | PRN

## 2019-08-27 MED ORDER — SODIUM CHLORIDE 0.9% FLUSH
3.0000 mL | Freq: Once | INTRAVENOUS | Status: AC
  Administered 2019-08-27: 3 mL via INTRAVENOUS

## 2019-08-27 MED ORDER — METRONIDAZOLE 500 MG PO TABS: 500.0000 mg | ORAL_TABLET | Freq: Three times a day (TID) | ORAL | 0 refills | Status: AC

## 2019-08-27 MED ORDER — CIPROFLOXACIN HCL 500 MG PO TABS: 500.0000 mg | ORAL_TABLET | Freq: Two times a day (BID) | ORAL | 0 refills | Status: AC

## 2019-08-27 MED ORDER — METRONIDAZOLE 500 MG PO TABS
500.0000 mg | ORAL_TABLET | Freq: Once | ORAL | Status: AC
  Administered 2019-08-27: 500 mg via ORAL
  Filled 2019-08-27: qty 1

## 2019-08-27 MED ORDER — CIPROFLOXACIN HCL 500 MG PO TABS
500.0000 mg | ORAL_TABLET | Freq: Once | ORAL | Status: AC
  Administered 2019-08-27: 500 mg via ORAL
  Filled 2019-08-27: qty 1

## 2019-08-27 NOTE — ED Provider Notes (Signed)
[facility] EMERGENCY DEPARTMENT Provider Note   CSN: [id number] Arrival date & time: 08/27/19  1208     History Chief Complaint  Patient presents with  . Abdominal Pain    Darlene Curtis is a 79 y.o. female with history of anxiety, GERD, partial small bowel obstruction presenting for evaluation of acute onset, persistent and progressively worsening abdominal pains for 4 days.  Reports that symptoms began on Tuesday when she began experiencing some rectal pain with bowel movements.  Since then the rectal pain has resolved but she developed intermittent crampy lower abdominal pains radiating bilaterally.  It worsens in the mornings and with sitting upright.  She also has concurrent low back pain that is being evaluated by Dr. Johnsie Cancel with neurosurgery and is well controlled with hydrocodone.  This pain will radiate down the left lower extremity but she denies any bowel or bladder incontinence or saddle anesthesia.  No fevers or history of IV drug use.  She reports nausea but no vomiting, denies fevers, chills, chest pain, shortness of breath, urinary symptoms, melena, hematochezia, diarrhea or constipation.  She does feel a sensation of "bloating" in the upper abdomen.  The history is provided by the patient.       Past Medical History:  Diagnosis Date  . Anxiety   . GERD (gastroesophageal reflux disease)     Patient Active Problem List   Diagnosis Date Noted  . Partial small bowel obstruction (HCC) 11/12/2012  . Hyperkalemia 11/12/2012    Past Surgical History:  Procedure Laterality Date  . R shoulder surgery    . TUBAL LIGATION     27 years ago     OB History   No obstetric history on file.     No family history on file.  Social History   Tobacco Use  . Smoking status: Former Games developer  . Smokeless tobacco: Never Used  Substance Use Topics  . Alcohol use: No  . Drug use: No    Home Medications Prior to Admission medications     Medication Sig Start Date End Date Taking? Authorizing Provider  alendronate (FOSAMAX) 70 MG tablet Take 70 mg by mouth once a week. 06/27/19  Yes [provider]  ALPRAZolam Prudy Feeler) 0.5 MG tablet Take 1 tablet (0.5 mg total) by mouth at bedtime as needed for anxiety. Patient taking differently: Take 0.5 mg by mouth 2 (two) times daily as needed for anxiety.  09/14/16  Yes Neese, Hope M, NP  Black Elderberry 50 MG/5ML SYRP Take 15 mLs by mouth daily.   Yes [provider]  docusate sodium (COLACE) 100 MG capsule Take 100 mg by mouth daily as needed for mild constipation.   Yes [provider]  HYDROcodone-acetaminophen (NORCO/VICODIN) 5-325 MG tablet Take 1 tablet by mouth 2 (two) times daily as needed for severe pain (for arthritis pain).  03/30/18  Yes [provider]  Multiple Vitamins-Minerals (CENTRUM SILVER 50+WOMEN) TABS Take 1 tablet by mouth daily with breakfast.   Yes [provider]  Propylene Glycol (SYSTANE BALANCE OP) Place 1 drop into both eyes daily as needed (for dry eyes).   Yes [provider]  rosuvastatin (CRESTOR) 10 MG tablet Take 10 mg by mouth daily. 07/21/19  Yes [provider]  ciprofloxacin (CIPRO) 500 MG tablet Take 1 tablet (500 mg total) by mouth every 12 (twelve) hours for 7 days. 08/27/19 09/03/19  Michela Pitcher A, PA-C  docusate sodium (COLACE) 100 MG capsule Take 1 capsule (100 mg  total) by mouth every 12 (twelve) hours. Patient not taking: Reported on 08/27/2019 08/18/17   Wurst, [place], PA-C  metroNIDAZOLE (FLAGYL) 500 MG tablet Take 1 tablet (500 mg total) by mouth 3 (three) times daily for 7 days. 08/27/19 09/03/19  Michela Pitcher A, PA-C  ondansetron (ZOFRAN) 4 MG tablet Take 1 tablet (4 mg total) by mouth every 8 (eight) hours as needed for nausea or vomiting. 08/27/19   Luevenia Maxin, Dawnyel Leven A, PA-C  polyethylene glycol (MIRALAX / GLYCOLAX) packet Take 17 g by mouth daily. Patient not taking: Reported on 08/27/2019  08/18/17   Alvino Chapel [place], PA-C    Allergies    Amoxicillin  Review of Systems   Review of Systems  Constitutional: Negative for chills and fever.  Respiratory: Negative for shortness of breath.   Cardiovascular: Negative for chest pain.  Gastrointestinal: Positive for abdominal pain, nausea and rectal pain (resolved). Negative for constipation, diarrhea and vomiting.  Genitourinary: Negative for dysuria, frequency and hematuria.  All other systems reviewed and are negative.   Physical Exam Updated Vital Signs BP (!) 141/94 (BP Location: Right Arm)   Pulse 90   Temp 98.5 F (36.9 C) (Oral)   Resp 16   Ht 5\' 2"  (1.575 m)   Wt 65.8 kg   SpO2 100%   BMI 26.52 kg/m   Physical Exam Vitals and nursing note reviewed.  Constitutional:      General: She is not in acute distress.    Appearance: She is well-developed.  HENT:     Head: Normocephalic and atraumatic.  Eyes:     General:        Right eye: No discharge.        Left eye: No discharge.     Conjunctiva/sclera: Conjunctivae normal.  Neck:     Vascular: No JVD.     Trachea: No tracheal deviation.  Cardiovascular:     Rate and Rhythm: Normal rate and regular rhythm.  Pulmonary:     Effort: Pulmonary effort is normal.     Breath sounds: Normal breath sounds.  Abdominal:     General: Abdomen is protuberant. Bowel sounds are decreased. There is no distension.     Palpations: Abdomen is soft.     Tenderness: There is abdominal tenderness in the right lower quadrant, epigastric area, suprapubic area and left lower quadrant. There is no right CVA tenderness, left CVA tenderness, guarding or rebound.  Skin:    General: Skin is warm and dry.     Findings: No erythema.  Neurological:     Mental Status: She is alert.  Psychiatric:        Behavior: Behavior normal.     ED Results / Procedures / Treatments   Labs (all labs ordered are listed, but only abnormal results are displayed) Labs Reviewed  COMPREHENSIVE  METABOLIC PANEL - Abnormal; Notable for the following components:      Result Value   BUN 6 (*)    GFR calc non Af Amer 55 (*)    All other components within normal limits  CBC - Abnormal; Notable for the following components:   WBC 3.7 (*)    All other components within normal limits  URINALYSIS, ROUTINE W REFLEX MICROSCOPIC - Abnormal; Notable for the following components:   Color, Urine STRAW (*)    All other components within normal limits  LIPASE, BLOOD    EKG None  Radiology CT ABDOMEN PELVIS W CONTRAST  Result Date: 08/27/2019 CLINICAL DATA:  Diverticulitis EXAM: CT ABDOMEN  AND PELVIS WITH CONTRAST TECHNIQUE: Multidetector CT imaging of the abdomen and pelvis was performed using the standard protocol following bolus administration of intravenous contrast. CONTRAST:  OMNIPAQUE IOHEXOL 300 MG/ML  SOLN COMPARISON:  CT 11/12/2012 FINDINGS: Lower chest: Bandlike areas of scarring and/or atelectasis in the lung bases. Lung bases otherwise clear. Normal heart size. No pericardial effusion. Coronary artery calcifications are present. Hepatobiliary: No focal liver abnormality is seen. No gallstones, gallbladder wall thickening, or biliary dilatation. Pancreas: Unremarkable. No pancreatic ductal dilatation or surrounding inflammatory changes. Spleen: Normal in size without focal abnormality. Adrenals/Urinary Tract: Normal adrenal glands. Kidneys are normally located with symmetric enhancement and excretion. No suspicious renal lesion, urolithiasis or hydronephrosis. Mild circumferential bladder wall thickening with perivesicular hazy stranding. Stomach/Bowel: Distal esophagus, stomach and duodenal sweep are unremarkable. No small bowel wall thickening or dilatation. No evidence of obstruction. A normal appendix is visualized. No proximal colonic thickening or dilatation. There is segmental thickening and pericolonic inflammation which appears centered upon a culprit diverticulum near the  distal sigmoid (3/68, 7/88). Associated stranding in the low pelvis and perirectal fat. No extraluminal gas or organized abscess is seen. No pneumatosis. Vascular/Lymphatic: Atherosclerotic calcifications throughout the abdominal aorta and branch vessels. No aneurysm or ectasia. No enlarged abdominopelvic lymph nodes. Few reactive appearing nodes in the deep pelvis. Reproductive: Stranding along the posterior aspect of the uterus is likely reactive to the inflammatory changes of the rectosigmoid as above. Other: Deep pelvic stranding and pericolonic inflammation as detailed above. No free air. No bowel containing hernias. Musculoskeletal: Multilevel degenerative changes are present in the imaged portions of the spine. Mild grade 1 anterolisthesis L4 on L5 without pars defects. Additional degenerative changes in the SI joints. No acute osseous abnormality or suspicious osseous lesion. IMPRESSION: 1. Findings likely reflect an acute uncomplicated diverticulitis of the distal sigmoid colon. No extraluminal gas or organized abscess is seen. 2. Mild circumferential bladder wall thickening with perivesicular hazy stranding may represent a cystitis. Recommend correlation with urinalysis. 3. Coronary artery calcifications. 4. Aortic Atherosclerosis (ICD10-I70.0). Electronically Signed   By: Kreg Shropshire M.D.   On: 08/27/2019 17:11    Procedures Procedures (including critical care time)  Medications Ordered in ED Medications  sodium chloride flush (NS) 0.9 % injection 3 mL (3 mLs Intravenous Given 08/27/19 1730)  ondansetron (ZOFRAN) injection 4 mg (4 mg Intravenous Given 08/27/19 1541)  morphine 4 MG/ML injection 4 mg (4 mg Intravenous Given 08/27/19 1543)  iohexol (OMNIPAQUE) 300 MG/ML solution 100 mL (100 mLs Intravenous Contrast Given 08/27/19 1646)  HYDROmorphone (DILAUDID) injection 0.5 mg (0.5 mg Intravenous Given 08/27/19 1728)  ciprofloxacin (CIPRO) tablet 500 mg (500 mg Oral Given 08/27/19 1801)   metroNIDAZOLE (FLAGYL) tablet 500 mg (500 mg Oral Given 08/27/19 1801)    ED Course  I have reviewed the triage vital signs and the nursing notes.  Pertinent labs & imaging results that were available during my care of the patient were reviewed by me and considered in my medical decision making (see chart for details).  Clinical Course as of Aug 26 1813  Sat Aug 27, 2019  1742 79 yo female here with cramping lower abdominal pain for 5 days, mild nausea, loose daily BM.  Benign overall abodminal exam, mild LLQ ttp.  CT notable for sigmoid diverticulitis.  No evidence of cystitis on UA.  Patient well appearing otherwise.  Plan to tx with antibiotics, dispo home   [MT]    Clinical Course User Index [MT] Trifan, Kermit Balo, MD  MDM Rules/Calculators/A&P                       Patient presenting for evaluation of lower abdominal pain with nausea.  She is afebrile, vital signs are stable.  She is nontoxic in appearance.  Abdomen is soft with no rebound or guarding.  Lab work reviewed and interpreted by myself shows no leukocytosis but rather mild leukopenia, no anemia, no metabolic derangements, no renal insufficiency.  UA is not concerning for UTI or nephrolithiasis.  Imaging shows evidence of acute uncomplicated diverticulitis involving the distal sigmoid colon.  She initially had rectal pain but this has since resolved and work-up is inconsistent with Fournier's gangrene, perirectal abscess, or anal fissures or tears.  No evidence of acute surgical abdominal pathology.  Her pain has been controlled in the ED and she is tolerating sips of fluid without difficulty.  Serial abdominal examinations remain benign and on reevaluation she is resting comfortably in no distress.  Plan for discharge home with Cipro and Flagyl and close outpatient PCP follow-up.  Discussed strict ED return precautions. Patient verbalized understanding of and agreement with plan and is safe for discharge home at this  time. Patient seen and evaluated by Dr. Langston Masker who agrees with assessment and plan at this time.  Final Clinical Impression(s) / ED Diagnoses Final diagnoses:  Sigmoid diverticulitis    Rx / DC Orders ED Discharge Orders         Ordered    ciprofloxacin (CIPRO) 500 MG tablet  Every 12 hours     08/27/19 1810    metroNIDAZOLE (FLAGYL) 500 MG tablet  3 times daily     08/27/19 1810    ondansetron (ZOFRAN) 4 MG tablet  Every 8 hours PRN     08/27/19 1810           Renita Papa, PA-C 08/27/19 1818    Wyvonnia Dusky, MD 08/27/19 2227

## 2019-08-27 NOTE — Discharge Instructions (Signed)
Your work-up today showed evidence of what is called diverticulitis.  This is an infection of a part of the colon.  This is treated with antibiotics.  Please take all of your antibiotics until finished!   Take your antibiotics with food.  Common side effects of antibiotics include nausea, vomiting, abdominal discomfort, and diarrhea. You may help offset some of this with probiotics which you can buy or get in yogurt. Do not eat  or take the probiotics until 2 hours after your antibiotic.    You can take extra strength Tylenol every 6 hours as needed for pain.  Do not exceed more than 4000 mg of Tylenol daily.  You can take Zofran as needed for nausea and vomiting.  Wait around 10 or 15 minutes before you have anything to eat or drink to get this medicine time to work.  Follow-up with your primary care doctor for reevaluation of your symptoms.  Often times, patients require a colonoscopy about 6 weeks after diverticulitis to ensure that her symptoms have resolved entirely.  Return to the emergency department if any concerning signs or symptoms develop such as fevers, severe uncontrolled pain, persistent vomiting, blood in the urine or stool.

## 2019-08-27 NOTE — ED Triage Notes (Signed)
Pt reports she was having rectal pain when sitting a few days ago, saw her doctor who scheduled her for an U/S on Monday. States that rectal pain improved but now she is having some abd pain and nausea. Denies vomiting or diarrhea. Recently had some constipation on Tuesday that she took some miralax for-symptoms resolved.

## 2019-08-29 ENCOUNTER — Other Ambulatory Visit: Payer: Medicare Other

## 2019-09-14 ENCOUNTER — Other Ambulatory Visit: Payer: Self-pay

## 2019-09-14 ENCOUNTER — Ambulatory Visit (INDEPENDENT_AMBULATORY_CARE_PROVIDER_SITE_OTHER): Payer: Medicare Other | Admitting: Psychiatry

## 2019-09-14 ENCOUNTER — Encounter (HOSPITAL_COMMUNITY): Payer: Self-pay | Admitting: Psychiatry

## 2019-09-14 DIAGNOSIS — F411 Generalized anxiety disorder: Secondary | ICD-10-CM

## 2019-09-15 ENCOUNTER — Encounter (HOSPITAL_COMMUNITY): Payer: Self-pay | Admitting: Psychiatry
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
## 2019-09-24 ENCOUNTER — Other Ambulatory Visit: Payer: Self-pay

## 2019-09-24 ENCOUNTER — Encounter (HOSPITAL_COMMUNITY): Payer: Self-pay

## 2019-09-24 ENCOUNTER — Emergency Department (HOSPITAL_COMMUNITY)
Admission: EM | Admit: 2019-09-24 | Discharge: 2019-09-24 | Disposition: A | Discharge status: Home / Self Care | Payer: Medicare Other | Attending: Emergency Medicine | Admitting: Emergency Medicine

## 2019-09-24 ENCOUNTER — Emergency Department (HOSPITAL_COMMUNITY): Payer: Medicare Other

## 2019-09-24 DIAGNOSIS — R1031 Right lower quadrant pain: Secondary | ICD-10-CM | POA: Insufficient documentation

## 2019-09-24 DIAGNOSIS — M5442 Lumbago with sciatica, left side: Secondary | ICD-10-CM | POA: Insufficient documentation

## 2019-09-24 DIAGNOSIS — Z87891 Personal history of nicotine dependence: Secondary | ICD-10-CM | POA: Diagnosis not present

## 2019-09-24 DIAGNOSIS — Z79899 Other long term (current) drug therapy: Secondary | ICD-10-CM | POA: Insufficient documentation

## 2019-09-24 DIAGNOSIS — G8929 Other chronic pain: Secondary | ICD-10-CM

## 2019-09-24 LAB — URINALYSIS, ROUTINE W REFLEX MICROSCOPIC
Bilirubin Urine: NEGATIVE
Glucose, UA: NEGATIVE mg/dL
Hgb urine dipstick: NEGATIVE
Ketones, ur: NEGATIVE mg/dL
Leukocytes,Ua: NEGATIVE
Nitrite: NEGATIVE
Protein, ur: NEGATIVE mg/dL
Specific Gravity, Urine: 1.009 (ref 1.005–1.030)
pH: 6 (ref 5.0–8.0)

## 2019-09-24 LAB — COMPREHENSIVE METABOLIC PANEL
ALT: 22 U/L (ref 0–44)
AST: 26 U/L (ref 15–41)
Albumin: 4 g/dL (ref 3.5–5.0)
Alkaline Phosphatase: 69 U/L (ref 38–126)
Anion gap: 8 (ref 5–15)
BUN: 6 mg/dL — ABNORMAL LOW (ref 8–23)
CO2: 29 mmol/L (ref 22–32)
Calcium: 9.5 mg/dL (ref 8.9–10.3)
Chloride: 106 mmol/L (ref 98–111)
Creatinine, Ser: 0.93 mg/dL (ref 0.44–1.00)
GFR calc Af Amer: >60 mL/min (ref >60)
GFR calc non Af Amer: 59 mL/min — ABNORMAL LOW (ref >60)
Glucose, Bld: 93 mg/dL (ref 70–99)
Potassium: 3.8 mmol/L (ref 3.5–5.1)
Sodium: 143 mmol/L (ref 135–145)
Total Bilirubin: 0.8 mg/dL (ref 0.3–1.2)
Total Protein: 6.8 g/dL (ref 6.5–8.1)

## 2019-09-24 LAB — CBC
HCT: 42 % (ref 36.0–46.0)
Hemoglobin: 13 g/dL (ref 12.0–15.0)
MCH: 28 pg (ref 26.0–34.0)
MCHC: 31 g/dL (ref 30.0–36.0)
MCV: 90.5 fL (ref 80.0–100.0)
Platelets: 240 10*3/uL (ref 150–400)
RBC: 4.64 MIL/uL (ref 3.87–5.11)
RDW: 14 % (ref 11.5–15.5)
WBC: 3.1 10*3/uL — ABNORMAL LOW (ref 4.0–10.5)
nRBC: 0 % (ref 0.0–0.2)

## 2019-09-24 LAB — LIPASE, BLOOD: Lipase: 30 U/L (ref 11–51)

## 2019-09-24 MED ORDER — HYDROCODONE-ACETAMINOPHEN 5-325 MG PO TABS
1.0000 | ORAL_TABLET | Freq: Once | ORAL | Status: AC
  Administered 2019-09-24: 1 via ORAL
  Filled 2019-09-24: qty 1

## 2019-09-24 MED ORDER — ACETAMINOPHEN 325 MG PO TABS
650.0000 mg | ORAL_TABLET | Freq: Once | ORAL | Status: AC
  Administered 2019-09-24: 650 mg via ORAL
  Filled 2019-09-24: qty 2

## 2019-09-24 MED ORDER — SODIUM CHLORIDE 0.9% FLUSH: 3.0000 mL | Freq: Once | INTRAVENOUS | Status: DC

## 2019-09-24 MED ORDER — IOHEXOL 300 MG/ML  SOLN
100.0000 mL | Freq: Once | INTRAMUSCULAR | Status: AC | PRN
  Administered 2019-09-24: 100 mL via INTRAVENOUS

## 2019-09-24 NOTE — ED Notes (Signed)
Patient verbalizes understanding of discharge instructions. Opportunity for questioning and answers were provided. Armband removed by staff, pt discharged from ED. Pt. ambulatory and discharged home.  

## 2019-09-24 NOTE — ED Triage Notes (Signed)
Pt arrives to ED w/ c/o 8/10 LLQ abdominal pain. Pt seen recently and diagnoses w/ diverticulitis.

## 2019-09-24 NOTE — ED Provider Notes (Signed)
[facility] EMERGENCY DEPARTMENT Provider Note   CSN: [id number] Arrival date & time: 09/24/19  1239     History Chief Complaint  Patient presents with  . Abdominal Pain    Darlene Curtis is a 79 y.o. female.  Patient is a 79 year old female with recent diagnosis of diverticulitis on antibiotics on 08/27/2019 patient still in pain but no symptomatic signs of infection.  Patient has history of constipation has had bowel movements last 2 days that were hard.  Patient is taking stool softeners.  Patient also has left-sided back pain with sciatic pain without red flag symptoms.  Patient endorsing right flank pain with mild CVA tenderness.  Patient denies nausea or vomiting.  The history is provided by the patient and medical records.  Illness Location:  Abdomen, lower back Quality:  Pain Severity:  Moderate Onset quality:  Gradual Timing:  Constant Progression:  Unchanged Chronicity:  Chronic Context:  Patient has chronic low back pain with sciatic nerve pain on the left, patient has right flank pain and right abdominal pain. Relieved by:  Narcotic pain medication Worsened by:  Standing Ineffective treatments:  Nothing Associated symptoms: abdominal pain   Associated symptoms: no chest pain, no congestion, no cough, no diarrhea, no fatigue, no fever, no headaches, no loss of consciousness, no nausea, no shortness of breath and no vomiting        Past Medical History:  Diagnosis Date  . Anxiety   . GERD (gastroesophageal reflux disease)     Patient Active Problem List   Diagnosis Date Noted  . Partial small bowel obstruction ([place]) 11/12/2012  . Hyperkalemia 11/12/2012    Past Surgical History:  Procedure Laterality Date  . R shoulder surgery    . TUBAL LIGATION     27 years ago     OB History   No obstetric history on file.     Family History  Problem Relation Age of Onset  . Depression Son   . Alcohol abuse Son     Social History    Tobacco Use  . Smoking status: Former Research scientist (life sciences)  . Smokeless tobacco: Never Used  Substance Use Topics  . Alcohol use: No  . Drug use: No    Home Medications Prior to Admission medications   Medication Sig Start Date End Date Taking? Authorizing Provider  alendronate (FOSAMAX) 70 MG tablet Take 70 mg by mouth once a week. 06/27/19   [provider]  ALPRAZolam Duanne Moron) 0.5 MG tablet Take 1 tablet (0.5 mg total) by mouth at bedtime as needed for anxiety. Patient taking differently: Take 0.5 mg by mouth 2 (two) times daily as needed for anxiety.  09/14/16   Ashley Murrain, NP  Black Elderberry 50 MG/5ML SYRP Take 15 mLs by mouth daily.    [provider]  docusate sodium (COLACE) 100 MG capsule Take 1 capsule (100 mg total) by mouth every 12 (twelve) hours. Patient not taking: Reported on 09/14/2019 08/18/17   Stacey Drain, [place], PA-C  docusate sodium (COLACE) 100 MG capsule Take 100 mg by mouth daily as needed for mild constipation.    [provider]  HYDROcodone-acetaminophen (NORCO/VICODIN) 5-325 MG tablet Take 1 tablet by mouth 2 (two) times daily as needed for severe pain (for arthritis pain).  03/30/18   [provider]  Multiple Vitamins-Minerals (CENTRUM SILVER 50+WOMEN) TABS Take 1 tablet by mouth daily with breakfast.    [provider]  ondansetron (ZOFRAN) 4 MG tablet Take 1 tablet (4 mg  total) by mouth every 8 (eight) hours as needed for nausea or vomiting. Patient not taking: Reported on 09/14/2019 08/27/19   Michela Pitcher A, PA-C  polyethylene glycol (MIRALAX / GLYCOLAX) packet Take 17 g by mouth daily. Patient not taking: Reported on 08/27/2019 08/18/17   Wurst, [place], PA-C  Propylene Glycol (SYSTANE BALANCE OP) Place 1 drop into both eyes daily as needed (for dry eyes).    [provider]  rosuvastatin (CRESTOR) 10 MG tablet Take 10 mg by mouth daily. 07/21/19   [provider]    Allergies    Amoxicillin  Review of Systems    Review of Systems  Constitutional: Negative for fatigue and fever.  HENT: Negative for congestion.   Respiratory: Negative for cough and shortness of breath.   Cardiovascular: Negative for chest pain.  Gastrointestinal: Positive for abdominal pain. Negative for diarrhea, nausea and vomiting.  Genitourinary: Negative for dysuria and hematuria.  Musculoskeletal: Positive for back pain.  Neurological: Negative for loss of consciousness and headaches.  All other systems reviewed and are negative.   Physical Exam Updated Vital Signs BP (!) 150/84 (BP Location: Right Arm)   Pulse 72   Temp 98.5 F (36.9 C)   Resp 18   Ht 5\' 2"  (1.575 m)   Wt 64.4 kg   SpO2 98%   BMI 25.97 kg/m   Physical Exam Vitals and nursing note reviewed.  Constitutional:      General: She is not in acute distress.    Appearance: She is well-developed.  HENT:     Head: Normocephalic and atraumatic.     Mouth/Throat:     Mouth: Mucous membranes are moist.  Eyes:     Extraocular Movements: Extraocular movements intact.     Conjunctiva/sclera: Conjunctivae normal.  Cardiovascular:     Rate and Rhythm: Normal rate and regular rhythm.     Heart sounds: Normal heart sounds. No murmur heard.   Pulmonary:     Effort: Pulmonary effort is normal. No respiratory distress.     Breath sounds: Normal breath sounds.  Abdominal:     General: Bowel sounds are normal.     Palpations: Abdomen is soft.     Tenderness: There is generalized abdominal tenderness and tenderness in the right upper quadrant and right lower quadrant. There is right CVA tenderness.  Musculoskeletal:     Cervical back: Neck supple.       Back:  Skin:    General: Skin is warm and dry.  Neurological:     General: No focal deficit present.     Mental Status: She is alert and oriented to person, place, and time.  Psychiatric:        Mood and Affect: Mood normal.        Behavior: Behavior normal.     ED Results / Procedures / Treatments    Labs (all labs ordered are listed, but only abnormal results are displayed) Labs Reviewed  COMPREHENSIVE METABOLIC PANEL - Abnormal; Notable for the following components:      Result Value   BUN 6 (*)    GFR calc non Af Amer 59 (*)    All other components within normal limits  CBC - Abnormal; Notable for the following components:   WBC 3.1 (*)    All other components within normal limits  URINALYSIS, ROUTINE W REFLEX MICROSCOPIC - Abnormal; Notable for the following components:   Color, Urine STRAW (*)    All other components within normal limits  LIPASE,  BLOOD    EKG None  Radiology CT ABDOMEN PELVIS W CONTRAST  Result Date: 09/24/2019 CLINICAL DATA:  Left lower quadrant abdominal pain. EXAM: CT ABDOMEN AND PELVIS WITH CONTRAST TECHNIQUE: Multidetector CT imaging of the abdomen and pelvis was performed using the standard protocol following bolus administration of intravenous contrast. CONTRAST:  OMNIPAQUE IOHEXOL 300 MG/ML  SOLN COMPARISON:  CT abdomen pelvis dated Aug 27, 2019. FINDINGS: Lower chest: No acute abnormality. Hepatobiliary: No focal liver abnormality is seen. No gallstones, gallbladder wall thickening, or biliary dilatation. Pancreas: Unremarkable. No pancreatic ductal dilatation or surrounding inflammatory changes. Spleen: Normal in size without focal abnormality. Adrenals/Urinary Tract: Adrenal glands are unremarkable. Kidneys are normal, without renal calculi, focal lesion, or hydronephrosis. Bladder is unremarkable. Stomach/Bowel: Stomach is within normal limits. Appendix appears normal. No evidence of bowel wall thickening, distention, or inflammatory changes. Moderate sigmoid diverticulosis with resolved diverticulitis seen on the prior study. Vascular/Lymphatic: Aortic atherosclerosis. No enlarged abdominal or pelvic lymph nodes. Reproductive: Uterus and bilateral adnexa are unremarkable. Other: No abdominal wall hernia or abnormality. No abdominopelvic ascites.  No pneumoperitoneum. Musculoskeletal: No acute or significant osseous findings. IMPRESSION: 1. No acute intra-abdominal process. 2. Moderate sigmoid diverticulosis with resolved diverticulitis seen on the prior study. 3. Aortic Atherosclerosis (ICD10-I70.0). Electronically Signed   By: Obie Dredge M.D.   On: 09/24/2019 17:57    Procedures Procedures (including critical care time)  Medications Ordered in ED Medications  HYDROcodone-acetaminophen (NORCO/VICODIN) 5-325 MG per tablet 1 tablet (1 tablet Oral Given 09/24/19 1639)  acetaminophen (TYLENOL) tablet 650 mg (650 mg Oral Given 09/24/19 1639)  iohexol (OMNIPAQUE) 300 MG/ML solution 100 mL (100 mLs Intravenous Contrast Given 09/24/19 1723)    ED Course  I have reviewed the triage vital signs and the nursing notes.  Pertinent labs & imaging results that were available during my care of the patient were reviewed by me and considered in my medical decision making (see chart for details).    MDM Rules/Calculators/A&P                          Differential diagnosis: Diverticulitis, gastroenteritis, chronic abdominal pain, chronic low back pain with left-sided sciatica  ED physician interpretation of imaging: CT abdomen pelvis with contrast without free air or return of diverticulitis. ED physician interpretation of labs: CBC, CMP, UA without critical values  MDM: Patient is a 79 year old female presenting to the ED with multiple complaints including low back pain with sciatic nerve pain that is chronic in nature not worse than baseline, shifting abdominal pain without fever, nausea or vomiting found to have chronic back pain with static nerve pain as well as baseline abdominal pain with unremarkable CT and lab work appropriate for discharge home outpatient follow-up.  Patient signs are stable, patient afebrile.  Patient's physical exam with mild no abdominal pain, abdomen soft.  Patient has no red flag symptoms for low back  pain.  Somewhat difficult understand patient's reason for presentation to the ED but it appears that she has chronic abdominal and back pain and was interested in pain management with additional narcotic pain medication.  During patient's work-up she was given oral pain medication with improvement of pain.  However as patient has no acute changes or new needs for pain medication this was deferred to outpatient provider for treatment of her chronic pain.  Patient given Tylenol as well.  No lab work or imaging concerning for appendicitis, gastroenteritis, diverticulitis, dehydration.  Diagnosis, treatment and  plan of care was discussed and agreed upon with patient.  Patient comfortable with discharge at this time.   Key discharge instructions: You presented to the ED with continued chronic abdominal pain, worse in the right flank and the right lower quadrant  well as continued low back pain with left sided sciatica.  You were given oral pain meds.  Based on the CARES ACT we are not able to prescribe any narcotic pain medication for chronic pain.  You had a CT scan of your abdomen pelvis that showed no acute findings.  Specifically no diverticulitis, appendicitis.  Please follow-up with your primary care physician for further management of your chronic pain including sciatic nerve pain.  Based on your CT scan you should be able to get your colonoscopy at this time.  Recommend taking Tylenol at this time 650 mg to 1000 mg 4 times a day scheduled for pain.  If your doctor disguised to prescribe narcotic pain medication most of these also include Tylenol so do not exceed 4 g of Tylenol in 1 day.   Final Clinical Impression(s) / ED Diagnoses Final diagnoses:  Right lower quadrant abdominal pain  Chronic left-sided low back pain with left-sided sciatica    Rx / DC Orders ED Discharge Orders    None       Janeece Fitting, MD 09/25/19 0147    Maia Plan, MD 09/27/19 [phone]

## 2019-09-24 NOTE — Discharge Instructions (Addendum)
You presented to the ED with continued chronic abdominal pain, worse in the right flank and the right lower quadrant  well as continued low back pain with left sided sciatica.  You were given oral pain meds.  Based on the CARES ACT we are not able to prescribe any narcotic pain medication for chronic pain.  He had a CT scan of her abdomen pelvis that showed no acute findings.  Specifically no diverticulitis, appendicitis.  Please follow-up with your primary care physician for further management of your chronic pain including sciatic nerve pain.  Based on your CT scan you should be able to get your colonoscopy at this time.  Recommend taking Tylenol at this time 650 mg to 1000 mg 4 times a day scheduled for pain.  If your doctor disguised to prescribe narcotic pain medication most of these also include Tylenol so do not exceed 4 g of Tylenol in 1 day.

## 2019-09-28 ENCOUNTER — Ambulatory Visit (INDEPENDENT_AMBULATORY_CARE_PROVIDER_SITE_OTHER): Payer: Medicare Other | Admitting: Psychiatry

## 2019-09-28 ENCOUNTER — Other Ambulatory Visit: Payer: Self-pay

## 2019-09-28 DIAGNOSIS — F411 Generalized anxiety disorder: Secondary | ICD-10-CM

## 2019-09-28 NOTE — Progress Notes (Signed)
Virtual Visit via Telephone Note  I connected with Darlene Curtis on 09/28/19 at 1:08 PM EDT  by telephone and verified that I am speaking with the correct person using two identifiers.   I discussed the limitations, risks, security and privacy concerns of performing an evaluation and management service by telephone and the availability of in person appointments. I also discussed with the patient   that there may be a patient responsible charge related to this service. The patient expressed understanding and agreed to proceed.  I provided 52 minutes of non-face-to-face time during this encounter.   Darlene Salvage, LCSW    THERAPIST PROGRESS NOTE   Location:  Patient - Home/  Provider - [facility] Outpatient [place] office   Session Time: Wednesday 09/28/2019 1:08 PM - -2:00 PM   Participation Level: Active  Behavioral Response: AlertAnxious  Type of Therapy: Individual Therapy  Treatment Goals addressed: Establish rapport/reduce overall level, frequency, and intensity of anxiety so that it does not impair daily functioning  Interventions: CBT and Supportive  Summary: Darlene Curtis is a 79 y.o. female who  is referred for services by PCP Dr. Burton Apley due to patient experiencing stress and symptoms of anxiety.She reports seeing a psychiatrist several years ago. She denies any psychiatric hospitalizations.  Per her report, patient initially began experiencing anxiety around 79 years old.  She eventually got better and began experiencing anxiety again when she had children.  She states having waves of fear of something happening and then she will be responsible.  She  reports multiple losses including the death of her sister, son, husband, and mother.  She reports her main stress now is her youngest son as he has alcohol and substance abuse issues.  Patient's current symptoms include anxiety, excessive worry, poor motivation, tearfulness, poor concentration, restlessness, and  sleep difficulty.  Patient last was seen via virtual visit 2 weeks ago.  She experiences moderate symptoms of anxiety.  However she reports less stress regarding son as she is beginning to have more realistic expectations about son and is limiting her contact with son.  She has tried to become more involved in activities such as being involved with her daughter and grandchildren, decorating her home, and gardening.  Patient also reports she has been using her spirituality/faith in God to cope.  She reports a pattern of taking responsibility for others situations and problems.  She reports thoughts of people not loving her and fears rejection if she does not do what they want her to do especially her children and grandchildren.   Suicidal/Homicidal: Nowithout intent/plan  Therapist Response: Established rapport, reviewed symptoms, discussed stressors, facilitated expression of thoughts and feelings, validated feelings, praised and reinforced patient's efforts to become more involved in activities, developed treatment plan, obtained patient's permission to initial plan as this was a virtual visit, assisted patient examine her pattern of interaction with her family, also began to assist patient identify expectations of these relationships, began to orient patient to CBT, will send patient handout on generalized anxiety disorder in preparation for next session, discussed  referral to psychiatrist Dr. Vanetta Shawl for medication evaluation.  Plan: Return again in 2 weeks.  Diagnosis: Axis I: Generalized Anxiety Disorder    Axis II: No diagnosis    Darlene Salvage, LCSW 09/28/2019

## 2019-10-12 ENCOUNTER — Ambulatory Visit (INDEPENDENT_AMBULATORY_CARE_PROVIDER_SITE_OTHER): Payer: Medicare Other | Admitting: Psychiatry

## 2019-10-12 ENCOUNTER — Other Ambulatory Visit: Payer: Self-pay

## 2019-10-12 DIAGNOSIS — F411 Generalized anxiety disorder: Secondary | ICD-10-CM | POA: Diagnosis not present

## 2019-10-12 NOTE — Progress Notes (Signed)
Virtual Visit via Telephone Note  I connected with Darlene Curtis on 10/12/19 at 1:10 PM EDT  by telephone and verified that I am speaking with the correct person using two identifiers.   I discussed the limitations, risks, security and privacy concerns of performing an evaluation and management service by telephone and the availability of in person appointments. I also discussed with the patient that there may be a patient responsible charge related to this service. The patient expressed understanding and agreed to proceed.   I provided 44 minutes of non-face-to-face time during this encounter.   [name], LCSW        THERAPIST PROGRESS NOTE   Location:  Patient - Home/  Provider - [facility] Outpatient [place] office   Session Time: Wednesday 10/12/2019 1:10 PM - 1:54 PM  Participation Level: Active  Behavioral Response: AlertAnxious  Type of Therapy: Individual Therapy  Treatment Goals addressed: Establish rapport/reduce overall level, frequency, and intensity of anxiety so that it does not impair daily functioning  Interventions: CBT and Supportive  Summary: Darlene Curtis is a 79 y.o. female who  is referred for services by PCP Dr. Burton Apley due to patient experiencing stress and symptoms of anxiety.She reports seeing a psychiatrist several years ago. She denies any psychiatric hospitalizations.  Per her report, patient initially began experiencing anxiety around 79 years old.  She eventually got better and began experiencing anxiety again when she had children.  She states having waves of fear of something happening and then she will be responsible.  She  reports multiple losses including the death of her sister, son, husband, and mother.  She reports her main stress now is her youngest son as he has alcohol and substance abuse issues.  Patient's current symptoms include anxiety, excessive worry, poor motivation, tearfulness, poor concentration, restlessness, and  sleep difficulty.  Patient last was seen via virtual visit 2 weeks ago.  She experiences minimal symptoms of anxiety and moderate symptoms of depressed. Patient reports less worry but increased sadness, depression and loneliness.  She reports various instances of being feeling hurt by other people's comments and thoughts of feeling unimportant.  However she reports trying to examine her thoughts and says this can be helpful.  She also is trying to stay more involved in activities.   Suicidal/Homicidal: Nowithout intent/plan  Therapist Response: reviewed symptoms, discussed stressors, facilitated expression of thoughts and feelings, validated feelings, praised and reinforced patient's efforts to become more involved in activities, assisted patient try to identify triggers of increased depressed mood, oriented patient to CBT, encouraged patient to maintain consistent involvement in activities, discussed  referral to psychiatrist Dr. Vanetta Shawl for medication evaluation.  Plan: Return again in 2 weeks.  Diagnosis: Axis I: Generalized Anxiety Disorder    Axis II: No diagnosis    [name], LCSW 10/12/2019

## 2019-10-26 ENCOUNTER — Ambulatory Visit (INDEPENDENT_AMBULATORY_CARE_PROVIDER_SITE_OTHER): Payer: Medicare Other | Admitting: Psychiatry

## 2019-10-26 ENCOUNTER — Other Ambulatory Visit: Payer: Self-pay

## 2019-10-26 DIAGNOSIS — F411 Generalized anxiety disorder: Secondary | ICD-10-CM

## 2019-10-26 NOTE — Progress Notes (Signed)
Virtual Visit via Telephone Note  I connected with Darlene Curtis on 10/26/19 at 1:15 PM EDT  by telephone and verified that I am speaking with the correct person using two identifiers.   I discussed the limitations, risks, security and privacy concerns of performing an evaluation and management service by telephone and the availability of in person appointments. I also discussed with the patient that there may be a patient responsible charge related to this service. The patient expressed understanding and agreed to proceed.  I provided 30 minutes of non-face-to-face time during this encounter.   [name], LCSW    THERAPIST PROGRESS NOTE   Location:  Patient - Home/  Provider - [facility] Outpatient [place] office   Session Time: Wednesday 10/26/2019 1:15 PM -  1:45 PM  Participation Level: Active  Behavioral Response: Alert/ less anxious/ improved mood   Type of Therapy: Individual Therapy  Treatment Goals addressed: Establish rapport/reduce overall level, frequency, and intensity of anxiety so that it does not impair daily functioning  Interventions: CBT and Supportive  Summary: Darlene Curtis is a 79 y.o. female who  is referred for services by PCP Dr. Burton Apley due to patient experiencing stress and symptoms of anxiety.She reports seeing a psychiatrist several years ago. She denies any psychiatric hospitalizations.  Per her report, patient initially began experiencing anxiety around 79 years old.  She eventually got better and began experiencing anxiety again when she had children.  She states having waves of fear of something happening and then she will be responsible.  She  reports multiple losses including the death of her sister, son, husband, and mother.  She reports her main stress now is her youngest son as he has alcohol and substance abuse issues.  Patient's current symptoms include anxiety, excessive worry, poor motivation, tearfulness, poor concentration,  restlessness, and sleep difficulty.  Patient last was seen via virtual visit 2 weeks ago.  She experiences minimal symptoms of anxiety and depression. She states having two really good days last week.  Her report, she enjoyed time with her family and have a more positive thoughts on those days.  She reports a great understanding of the connection between her thoughts and her feelings.  She continues to have should and ought thought patterns that result in guilt and worry regarding some of her interactions with others. She reports difficulty saying no and setting/limits with others especially family.   Suicidal/Homicidal: Nowithout intent/plan  Therapist Response: reviewed symptoms, assisted patient examine her thoughts and behaviors she experienced during her to really good days, reviewed the connection between thoughts/mood/behavior, assisted patient examine her pattern of interaction with others, began to discuss thoughts inhibiting her ability to be assertive, began to discuss personal rights, will send patient handout on personal rights and assertive communication in preparation for next session, encouraged patient to maintain involvement in activities  Plan: Return again in 2 weeks.  Diagnosis: Axis I: Generalized Anxiety Disorder    Axis II: No diagnosis    [name], LCSW 10/26/2019

## 2019-11-10 ENCOUNTER — Other Ambulatory Visit: Payer: Self-pay

## 2019-11-10 ENCOUNTER — Ambulatory Visit (HOSPITAL_COMMUNITY): Payer: Medicare Other | Admitting: Psychiatry

## 2019-11-18 ENCOUNTER — Other Ambulatory Visit: Payer: Self-pay

## 2019-11-18 ENCOUNTER — Emergency Department (HOSPITAL_COMMUNITY)
Admission: EM | Admit: 2019-11-18 | Discharge: 2019-11-18 | Disposition: A | Discharge status: Home / Self Care | Payer: Medicare Other | Attending: Emergency Medicine | Admitting: Emergency Medicine

## 2019-11-18 ENCOUNTER — Encounter (HOSPITAL_COMMUNITY): Payer: Self-pay | Admitting: Emergency Medicine

## 2019-11-18 DIAGNOSIS — Z87891 Personal history of nicotine dependence: Secondary | ICD-10-CM | POA: Insufficient documentation

## 2019-11-18 DIAGNOSIS — R110 Nausea: Secondary | ICD-10-CM

## 2019-11-18 DIAGNOSIS — Z79899 Other long term (current) drug therapy: Secondary | ICD-10-CM | POA: Diagnosis not present

## 2019-11-18 DIAGNOSIS — M5432 Sciatica, left side: Secondary | ICD-10-CM

## 2019-11-18 DIAGNOSIS — F32A Depression, unspecified: Secondary | ICD-10-CM

## 2019-11-18 DIAGNOSIS — M5442 Lumbago with sciatica, left side: Secondary | ICD-10-CM | POA: Diagnosis not present

## 2019-11-18 DIAGNOSIS — F329 Major depressive disorder, single episode, unspecified: Secondary | ICD-10-CM | POA: Diagnosis not present

## 2019-11-18 LAB — COMPREHENSIVE METABOLIC PANEL
ALT: 24 U/L (ref 0–44)
AST: 21 U/L (ref 15–41)
Albumin: 3.6 g/dL (ref 3.5–5.0)
Alkaline Phosphatase: 58 U/L (ref 38–126)
Anion gap: 12 (ref 5–15)
BUN: 10 mg/dL (ref 8–23)
CO2: 24 mmol/L (ref 22–32)
Calcium: 9.3 mg/dL (ref 8.9–10.3)
Chloride: 104 mmol/L (ref 98–111)
Creatinine, Ser: 0.98 mg/dL (ref 0.44–1.00)
GFR calc Af Amer: >60 mL/min (ref >60)
GFR calc non Af Amer: 56 mL/min — ABNORMAL LOW (ref >60)
Glucose, Bld: 125 mg/dL — ABNORMAL HIGH (ref 70–99)
Potassium: 3.7 mmol/L (ref 3.5–5.1)
Sodium: 140 mmol/L (ref 135–145)
Total Bilirubin: 0.6 mg/dL (ref 0.3–1.2)
Total Protein: 6.1 g/dL — ABNORMAL LOW (ref 6.5–8.1)

## 2019-11-18 LAB — URINALYSIS, ROUTINE W REFLEX MICROSCOPIC
Bilirubin Urine: NEGATIVE
Glucose, UA: NEGATIVE mg/dL
Hgb urine dipstick: NEGATIVE
Ketones, ur: NEGATIVE mg/dL
Nitrite: NEGATIVE
Protein, ur: NEGATIVE mg/dL
Specific Gravity, Urine: 1.006 (ref 1.005–1.030)
pH: 6 (ref 5.0–8.0)

## 2019-11-18 LAB — LIPASE, BLOOD: Lipase: 36 U/L (ref 11–51)

## 2019-11-18 LAB — CBC
HCT: 43.3 % (ref 36.0–46.0)
Hemoglobin: 13.5 g/dL (ref 12.0–15.0)
MCH: 28 pg (ref 26.0–34.0)
MCHC: 31.2 g/dL (ref 30.0–36.0)
MCV: 89.8 fL (ref 80.0–100.0)
Platelets: 242 10*3/uL (ref 150–400)
RBC: 4.82 MIL/uL (ref 3.87–5.11)
RDW: 15.4 % (ref 11.5–15.5)
WBC: 6.1 10*3/uL (ref 4.0–10.5)
nRBC: 0 % (ref 0.0–0.2)

## 2019-11-18 MED ORDER — ONDANSETRON 4 MG PO TBDP
4.0000 mg | ORAL_TABLET | Freq: Three times a day (TID) | ORAL | 0 refills | Status: DC | PRN
Start: 2019-11-18 — End: 2022-10-24

## 2019-11-18 MED ORDER — METHOCARBAMOL 500 MG PO TABS
500.0000 mg | ORAL_TABLET | Freq: Two times a day (BID) | ORAL | 0 refills | Status: DC
Start: 2019-11-18 — End: 2019-11-18

## 2019-11-18 MED ORDER — HYDROCODONE-ACETAMINOPHEN 5-325 MG PO TABS
1.0000 | ORAL_TABLET | Freq: Once | ORAL | Status: AC
  Administered 2019-11-18: 1 via ORAL
  Filled 2019-11-18: qty 1

## 2019-11-18 MED ORDER — HYDROCODONE-ACETAMINOPHEN 5-325 MG PO TABS: 1.0000 | ORAL_TABLET | Freq: Four times a day (QID) | ORAL | 0 refills | Status: DC | PRN

## 2019-11-18 MED ORDER — ONDANSETRON 4 MG PO TBDP
4.0000 mg | ORAL_TABLET | Freq: Three times a day (TID) | ORAL | 0 refills | Status: DC | PRN
Start: 2019-11-18 — End: 2019-11-18

## 2019-11-18 MED ORDER — SODIUM CHLORIDE 0.9% FLUSH: 3.0000 mL | Freq: Once | INTRAVENOUS | Status: DC

## 2019-11-18 NOTE — ED Triage Notes (Signed)
Pt states she has been having some anxiety/depression recently, saw her doctor who prescribed her some medication but reports that the medicine caused her to feel very nauseated. States she called her doctor and stopped the medication. Pt denies any thoughts of SI or HI. A/ox4, resp e/u, nad.

## 2019-11-18 NOTE — ED Provider Notes (Signed)
[facility] EMERGENCY DEPARTMENT Provider Note   CSN: [id number] Arrival date & time: 11/18/19  1247     History Chief Complaint  Patient presents with  . Nausea  . Anxiety    Darlene Curtis is a 79 y.o. female who presents to ED for multiple complaints. 1.  Nausea.  Reports 1 week history of nausea ever since starting to take Mobic as prescribed by her PCP for her sciatica.  Reports decreased appetite and feeling "sick to my stomach."  Denies any abdominal pain, vomiting, changes to bowel movements, chest pain, shortness of breath or fever. #2 anxiety and depression.  Reports history of anxiety but noticed for the past week that her depression has gotten worse.  States that "my daughter hears me cry all the time and I do not know why."  She does note that in therapy she recently spoke about an incident with her mother and she believes this may have triggered her worsening depression.  Also concerned that her son suffered from a fall recently with several fractures in his neck.  She denies any SI, HI, AVH.   #3 reports sciatica.  Right lower back pain that radiates down her leg.  She was given Mobic by PCP.  Denies any injuries or falls, numbness in arms or legs, saddle anesthesia, prior back surgeries.  She remains ambulatory.  HPI     Past Medical History:  Diagnosis Date  . Anxiety   . GERD (gastroesophageal reflux disease)     Patient Active Problem List   Diagnosis Date Noted  . Partial small bowel obstruction (HCC) 11/12/2012  . Hyperkalemia 11/12/2012    Past Surgical History:  Procedure Laterality Date  . R shoulder surgery    . TUBAL LIGATION     27 years ago     OB History   No obstetric history on file.     Family History  Problem Relation Age of Onset  . Depression Son   . Alcohol abuse Son     Social History   Tobacco Use  . Smoking status: Former Games developer  . Smokeless tobacco: Never Used  Substance Use Topics  . Alcohol  use: No  . Drug use: No    Home Medications Prior to Admission medications   Medication Sig Start Date End Date Taking? Authorizing Provider  alendronate (FOSAMAX) 70 MG tablet Take 70 mg by mouth once a week. 06/27/19   [provider]  ALPRAZolam Prudy Feeler) 0.5 MG tablet Take 1 tablet (0.5 mg total) by mouth at bedtime as needed for anxiety. Patient taking differently: Take 0.5 mg by mouth 2 (two) times daily as needed for anxiety.  09/14/16   Janne Napoleon, NP  Black Elderberry 50 MG/5ML SYRP Take 15 mLs by mouth daily.    [provider]  docusate sodium (COLACE) 100 MG capsule Take 1 capsule (100 mg total) by mouth every 12 (twelve) hours. Patient not taking: Reported on 09/14/2019 08/18/17   Alvino Chapel, [place], PA-C  docusate sodium (COLACE) 100 MG capsule Take 100 mg by mouth daily as needed for mild constipation.    [provider]  HYDROcodone-acetaminophen (NORCO/VICODIN) 5-325 MG tablet Take 1 tablet by mouth 2 (two) times daily as needed for severe pain (for arthritis pain).  03/30/18   [provider]  methocarbamol (ROBAXIN) 500 MG tablet Take 1 tablet (500 mg total) by mouth 2 (two) times daily. 11/18/19   Shad Ledvina, PA-C  Multiple Vitamins-Minerals (CENTRUM SILVER 50+WOMEN) TABS  Take 1 tablet by mouth daily with breakfast.    [provider]  ondansetron (ZOFRAN ODT) 4 MG disintegrating tablet Take 1 tablet (4 mg total) by mouth every 8 (eight) hours as needed for nausea or vomiting. 11/18/19   Phyllicia Dudek, PA-C  ondansetron (ZOFRAN) 4 MG tablet Take 1 tablet (4 mg total) by mouth every 8 (eight) hours as needed for nausea or vomiting. Patient not taking: Reported on 09/14/2019 08/27/19   Michela Pitcher A, PA-C  polyethylene glycol (MIRALAX / GLYCOLAX) packet Take 17 g by mouth daily. Patient not taking: Reported on 08/27/2019 08/18/17   Wurst, [place], PA-C  Propylene Glycol (SYSTANE BALANCE OP) Place 1 drop into both eyes daily as needed (for dry eyes).     [provider]  rosuvastatin (CRESTOR) 10 MG tablet Take 10 mg by mouth daily. 07/21/19   [provider]    Allergies    Amoxicillin  Review of Systems   Review of Systems  Constitutional: Negative for appetite change, chills and fever.  HENT: Negative for ear pain, rhinorrhea, sneezing and sore throat.   Eyes: Negative for photophobia and visual disturbance.  Respiratory: Negative for cough, chest tightness, shortness of breath and wheezing.   Cardiovascular: Negative for chest pain and palpitations.  Gastrointestinal: Positive for nausea. Negative for abdominal pain, blood in stool, constipation, diarrhea and vomiting.  Genitourinary: Negative for dysuria, hematuria and urgency.  Musculoskeletal: Positive for back pain. Negative for myalgias.  Skin: Negative for rash.  Neurological: Negative for dizziness, weakness and light-headedness.  Psychiatric/Behavioral: Positive for dysphoric mood.    Physical Exam Updated Vital Signs BP (!) 167/95 (BP Location: Right Arm)   Pulse 97   Temp 98.5 F (36.9 C) (Oral)   Resp 16   SpO2 100%   Physical Exam Vitals and nursing note reviewed.  Constitutional:      General: She is not in acute distress.    Appearance: She is well-developed.  HENT:     Head: Normocephalic and atraumatic.     Nose: Nose normal.  Eyes:     General: No scleral icterus.       Right eye: No discharge.        Left eye: No discharge.     Conjunctiva/sclera: Conjunctivae normal.  Cardiovascular:     Rate and Rhythm: Normal rate and regular rhythm.     Heart sounds: Normal heart sounds. No murmur heard.  No friction rub. No gallop.   Pulmonary:     Effort: Pulmonary effort is normal. No respiratory distress.     Breath sounds: Normal breath sounds.  Abdominal:     General: Bowel sounds are normal. There is no distension.     Palpations: Abdomen is soft.     Tenderness: There is no abdominal tenderness. There is no guarding.    Musculoskeletal:        General: Normal range of motion.     Cervical back: Normal range of motion and neck supple.     Comments: No midline spinal tenderness present in lumbar, thoracic or cervical spine. No step-off palpated. No visible bruising, edema or temperature change noted. No objective signs of numbness present. No saddle anesthesia. 2+ DP pulses bilaterally. Sensation intact to light touch. Strength 5/5 in bilateral lower extremities.  Skin:    General: Skin is warm and dry.     Findings: No rash.  Neurological:     Mental Status: She is alert and oriented to person, place, and time.  Motor: No abnormal muscle tone.     Coordination: Coordination normal.     ED Results / Procedures / Treatments   Labs (all labs ordered are listed, but only abnormal results are displayed) Labs Reviewed  COMPREHENSIVE METABOLIC PANEL - Abnormal; Notable for the following components:      Result Value   Glucose, Bld 125 (*)    Total Protein 6.1 (*)    GFR calc non Af Amer 56 (*)    All other components within normal limits  URINALYSIS, ROUTINE W REFLEX MICROSCOPIC - Abnormal; Notable for the following components:   APPearance HAZY (*)    Leukocytes,Ua LARGE (*)    Bacteria, UA RARE (*)    Non Squamous Epithelial 0-5 (*)    All other components within normal limits  LIPASE, BLOOD  CBC    EKG None  Radiology No results found.  Procedures Procedures (including critical care time)  Medications Ordered in ED Medications  sodium chloride flush (NS) 0.9 % injection 3 mL (3 mLs Intravenous Not Given 11/18/19 2053)    ED Course  I have reviewed the triage vital signs and the nursing notes.  Pertinent labs & imaging results that were available during my care of the patient were reviewed by me and considered in my medical decision making (see chart for details).    MDM Rules/Calculators/A&P                           79 year old female presenting to the ED for multiple  complaints. 1.  Nausea for the past week.  This started after taking Mobic.  Denies any abdominal pain, vomiting.  On exam abdomen is soft, nontender nondistended.  CBC, CMP, lipase and urinalysis unremarkable.  She denies urinary complaints, changes to bowel movements or vomiting.  Suspect that this is due to Mobic.  We will have her take this with food or discontinue use per her preference. 2 reports depression.  Denies any suicidal homicidal ideation.  She did not appear to be hallucinating.  She has a PCP who she is scheduled to see next week.  Feel that she will benefit from referral for counseling.  Do not see indication for TTS consult at this time. #3 reports sciatic pain.  No pain noted currently.  I believe the Mobic is what is making her nauseous, because she cannot take the Mobic, her symptoms are worse.  No neurological deficits or concern for cauda equina noted today.  No midline tenderness on exam. Will treat with muscle relaxer and PCP follow-up.   Patient is hemodynamically stable, in NAD, and able to ambulate in the ED. Evaluation does not show pathology that would require ongoing emergent intervention or inpatient treatment. I explained the diagnosis to the patient. Pain has been managed and has no complaints prior to discharge. Patient is comfortable with above plan and is stable for discharge at this time. All questions were answered prior to disposition. Strict return precautions for returning to the ED were discussed. Encouraged follow up with PCP.   An After Visit Summary was printed and given to the patient.   Portions of this note were generated with Scientist, clinical (histocompatibility and immunogenetics)Dragon dictation software. Dictation errors may occur despite best attempts at proofreading.  Final Clinical Impression(s) / ED Diagnoses Final diagnoses:  Nausea  Sciatica of left side  Depression, unspecified depression type    Rx / DC Orders ED Discharge Orders         Ordered  methocarbamol (ROBAXIN) 500 MG tablet   2 times daily     Discontinue  Reprint     11/18/19 2042    ondansetron (ZOFRAN ODT) 4 MG disintegrating tablet  Every 8 hours PRN     Discontinue  Reprint     11/18/19 2042           Dietrich Pates, PA-C 11/18/19 2100    Arby Barrette, MD 11/18/19 2126

## 2019-11-18 NOTE — ED Notes (Signed)
Patient verbalizes understanding of discharge instructions. Opportunity for questioning and answers were provided. Armband removed by staff, pt discharged from ED and ambulated to lobby to return home with daughter.   

## 2019-11-18 NOTE — Discharge Instructions (Addendum)
If you need to take the Mobic, take it with food. You will need to follow-up with your primary care provider regarding your worsening depression. Return to the ER for worsening back pain, injuries or falls, chest pain, shortness of breath, numbness in arms or legs.  Discuss peripheral vascular disease with your doctor.  This is when the blood flow in your arteries to your legs and feet is decreased.  The pulse in your right foot is stronger than the pulse in your left foot on your physical exam.  Have your doctor check this and continue to monitor your symptoms of sciatica and possible claudications (pain due to poor blood flow)

## 2019-11-23 ENCOUNTER — Ambulatory Visit (INDEPENDENT_AMBULATORY_CARE_PROVIDER_SITE_OTHER): Payer: Medicare Other | Admitting: Psychiatry

## 2019-11-23 ENCOUNTER — Other Ambulatory Visit: Payer: Self-pay

## 2019-11-23 DIAGNOSIS — F411 Generalized anxiety disorder: Secondary | ICD-10-CM | POA: Diagnosis not present

## 2019-11-23 NOTE — Progress Notes (Signed)
Virtual Visit via Telephone Note  I connected with Darlene Curtis on 11/23/19 at 1:12 PM EDT  by telephone and verified that I am speaking with the correct person using two identifiers.   I discussed the limitations, risks, security and privacy concerns of performing an evaluation and management service by telephone and the availability of in person appointments. I also discussed with the patient that there may be a patient responsible charge related to this service. The patient expressed understanding and agreed to proceed.  I provided 30 minutes of non-face-to-face time during this encounter.   [name], LCSW    THERAPIST PROGRESS NOTE   Location:  Patient - Home/  Provider - [facility] [place] office   Session Time: Wednesday 8/11//2021 1:12 PM - 1:42 PM   Participation Level: Active  Behavioral Response: Alert/ less anxious/ improved mood   Type of Therapy: Individual Therapy  Treatment Goals addressed:/reduce overall level, frequency, and intensity of anxiety so that it does not impair daily functioning  Interventions: CBT and Supportive  Summary: Darlene Curtis is a 79 y.o. female who  is referred for services by PCP Dr. Burton Apley due to patient experiencing stress and symptoms of anxiety.She reports seeing a psychiatrist several years ago. She denies any psychiatric hospitalizations.  Per her report, patient initially began experiencing anxiety around 79 years old.  She eventually got better and began experiencing anxiety again when she had children.  She states having waves of fear of something happening and then she will be responsible.  She  reports multiple losses including the death of her sister, son, husband, and mother.  She reports her main stress now is her youngest son as he has alcohol and substance abuse issues.  Patient's current symptoms include anxiety, excessive worry, poor motivation, tearfulness, poor concentration, restlessness, and  sleep difficulty.  Patient last was seen via virtual visit 2 weeks ago.  Per her report, she went to ER last week due to worsening symptoms of depression and anxiety. She initially is vague about triggers but then says it has been a variety of things/issues. Eventually she shares ongoing worry about her children/grandchildren may have been a trigger. She also says  therapy session about relationship with mother when she first began treatment with this clinician may also have been a trigger. She reports seeing PCP who prescribed xanax and says she feels much better.  She still expresses desire to see psychiatrist for medication evaluation.  She has resumed interest/involvement in activities. She also states she has decided she has to stop worrying. She is using her faith and spirituality. She reports reading over personal rights handout therapist sent and says she feels less guilty about standing up for self.  Suicidal/Homicidal: Nowithout intent/plan  Therapist Response: reviewed symptoms, assisted patient try to identify triggers of worsening symptoms of depression and anxiety, discussed stressors, facilitated expression of thoughts and feelings, validated feelings, discussed referral to psychiatrist, praised and reinforced behavioral activation to cope with depression and anxiety, encouraged patient to remain involved in activities, reviewed connection between thoughts/mood/behavior, encouraged patient to continue reading personal rights to identify statements to promote effective assertionPlan: Return again in 2 weeks.  Will refer to psychiatrist Dr. Vanetta Shawl for medication evaluation  Diagnosis: Axis I: Generalized Anxiety Disorder    Axis II: No diagnosis    [name], LCSW 11/23/2019

## 2020-01-23 ENCOUNTER — Other Ambulatory Visit: Payer: Self-pay

## 2020-01-23 ENCOUNTER — Ambulatory Visit (INDEPENDENT_AMBULATORY_CARE_PROVIDER_SITE_OTHER): Payer: Medicare Other | Admitting: Psychiatry

## 2020-01-23 DIAGNOSIS — F411 Generalized anxiety disorder: Secondary | ICD-10-CM | POA: Diagnosis not present

## 2020-01-23 NOTE — Progress Notes (Signed)
Virtual Visit via Telephone Note  I connected with Darlene Curtis on 01/23/20 at 11:05 AM EDT  by telephone and verified that I am speaking with the correct person using two identifiers.   I discussed the limitations, risks, security and privacy concerns of performing an evaluation and management service by telephone and the availability of in person appointments. I also discussed with the patient that there may be a patient responsible charge related to this service. The patient expressed understanding and agreed to proceed.  I provided 30 minutes of non-face-to-face time during this encounter.   [name], LCSW  THERAPIST PROGRESS NOTE   Location:  Patient - Home/  Provider - [facility] [place] office   Session Time: Monday 01/23/2020 11:05 AM - 11:35 AM   Participation Level: Active  Behavioral Response: Alert/ euthymic  Type of Therapy: Individual Therapy  Treatment Goals addressed:/reduce overall level, frequency, and intensity of anxiety so that it does not impair daily functioning  Interventions: CBT and Supportive  Summary: Darlene Curtis is a 79 y.o. female who  is referred for services by PCP Dr. Burton Apley due to patient experiencing stress and symptoms of anxiety.She reports seeing a psychiatrist several years ago. She denies any psychiatric hospitalizations.  Per her report, patient initially began experiencing anxiety around 79 years old.  She eventually got better and began experiencing anxiety again when she had children.  She states having waves of fear of something happening and then she will be responsible.  She  reports multiple losses including the death of her sister, son, husband, and mother.  She reports her main stress now is her youngest son as he has alcohol and substance abuse issues.  Patient's current symptoms include anxiety, excessive worry, poor motivation, tearfulness, poor concentration, restlessness, and sleep  difficulty.  Patient last was seen via virtual visit 2 months ago. She reports scheduling appointment to share how she is doing.   Per her report, she is experiencing decreased anxiety and worry. She has become more aware of her thoughts and reports using helpful replacement thoughts for negative thoughts. She reports recognizing how her thoughts affect her mood and behavior. She uses her spirituality to develop coping statements. She also uses prayer. She also reports learning she is not responsible for others and has been able to set/maintain limits. She also has maintained involvement in activities like decorating her home, spending time with her infant granddaughter, reading, and writing. She continues to see PCP for medication management but says she has decreased need for xanax. She reports calling in just to share how she is doing.  She expresses confidence in her ability to cope and successfully use coping skills.  Suicidal/Homicidal: Nowithout intent/plan  Therapist Response: reviewed symptoms, discussed patient's progress in treatment, praised and reinforced patient's use of helpful coping strategies, discussed lapse versus relapse, assisted patient identify early warning signs and ways to intervene, encouraged patient to continue using basic personal rights and to maintain consistent behavioral activation, discussed patient continuing to see PCP for medicatiion management as psychiatrist for adults in this practice is not available, processed patient's feelings about terminating services at this time, agreed to terminate psychotherapy services at this time as patient has accomplished her goals, encouraged patient to contact this practice should she need psychotherapy services in the future  Plan:   Diagnosis: Axis I: Generalized Anxiety Disorder    Axis II: No diagnosis    [name], LCSW 01/23/2020

## 2020-03-11 ENCOUNTER — Other Ambulatory Visit: Payer: Self-pay

## 2020-03-11 ENCOUNTER — Emergency Department (HOSPITAL_COMMUNITY)
Admission: EM | Admit: 2020-03-11 | Discharge: 2020-03-11 | Disposition: A | Discharge status: Home / Self Care | Payer: Medicare Other | Attending: Emergency Medicine | Admitting: Emergency Medicine

## 2020-03-11 ENCOUNTER — Encounter (HOSPITAL_COMMUNITY): Payer: Self-pay | Admitting: Emergency Medicine

## 2020-03-11 DIAGNOSIS — R350 Frequency of micturition: Secondary | ICD-10-CM | POA: Diagnosis not present

## 2020-03-11 DIAGNOSIS — Z87891 Personal history of nicotine dependence: Secondary | ICD-10-CM | POA: Insufficient documentation

## 2020-03-11 DIAGNOSIS — M545 Low back pain, unspecified: Secondary | ICD-10-CM | POA: Diagnosis not present

## 2020-03-11 DIAGNOSIS — F419 Anxiety disorder, unspecified: Secondary | ICD-10-CM | POA: Insufficient documentation

## 2020-03-11 DIAGNOSIS — R109 Unspecified abdominal pain: Secondary | ICD-10-CM | POA: Diagnosis present

## 2020-03-11 LAB — URINALYSIS, ROUTINE W REFLEX MICROSCOPIC
Bilirubin Urine: NEGATIVE
Glucose, UA: NEGATIVE mg/dL
Hgb urine dipstick: NEGATIVE
Ketones, ur: NEGATIVE mg/dL
Nitrite: NEGATIVE
Protein, ur: NEGATIVE mg/dL
Specific Gravity, Urine: 1.002 — ABNORMAL LOW (ref 1.005–1.030)
pH: 5 (ref 5.0–8.0)

## 2020-03-11 LAB — LIPASE, BLOOD: Lipase: 38 U/L (ref 11–51)

## 2020-03-11 LAB — COMPREHENSIVE METABOLIC PANEL
ALT: 27 U/L (ref 0–44)
AST: 20 U/L (ref 15–41)
Albumin: 4.1 g/dL (ref 3.5–5.0)
Alkaline Phosphatase: 64 U/L (ref 38–126)
Anion gap: 10 (ref 5–15)
BUN: 10 mg/dL (ref 8–23)
CO2: 27 mmol/L (ref 22–32)
Calcium: 9.7 mg/dL (ref 8.9–10.3)
Chloride: 107 mmol/L (ref 98–111)
Creatinine, Ser: 0.97 mg/dL (ref 0.44–1.00)
GFR, Estimated: >60 mL/min (ref >60)
Glucose, Bld: 96 mg/dL (ref 70–99)
Potassium: 3.8 mmol/L (ref 3.5–5.1)
Sodium: 144 mmol/L (ref 135–145)
Total Bilirubin: 0.5 mg/dL (ref 0.3–1.2)
Total Protein: 6.7 g/dL (ref 6.5–8.1)

## 2020-03-11 LAB — CBC
HCT: 43.6 % (ref 36.0–46.0)
Hemoglobin: 13.7 g/dL (ref 12.0–15.0)
MCH: 28.6 pg (ref 26.0–34.0)
MCHC: 31.4 g/dL (ref 30.0–36.0)
MCV: 91 fL (ref 80.0–100.0)
Platelets: 270 10*3/uL (ref 150–400)
RBC: 4.79 MIL/uL (ref 3.87–5.11)
RDW: 13.7 % (ref 11.5–15.5)
WBC: 6 10*3/uL (ref 4.0–10.5)
nRBC: 0 % (ref 0.0–0.2)

## 2020-03-11 MED ORDER — HYDROCODONE-ACETAMINOPHEN 5-325 MG PO TABS
1.0000 | ORAL_TABLET | Freq: Once | ORAL | Status: AC
  Administered 2020-03-11: 1 via ORAL
  Filled 2020-03-11: qty 1

## 2020-03-11 NOTE — ED Triage Notes (Signed)
C/o RLQ pain and R lower back pain that started today.  Denies nausea, vomiting, diarrhea, and urinary complaints.  States she did have L lower back pain that resolved after getting an injection at her doctor.  Doesn't feel that the pain radiates from RLQ to R flank area.

## 2020-03-11 NOTE — ED Provider Notes (Signed)
[facility] EMERGENCY DEPARTMENT Provider Note  CSN: [id number] Arrival date & time: 03/11/20 1501    History Chief Complaint  Patient presents with  . Abdominal Pain    HPI  Darlene Curtis is a 79 y.o. female with history of sciatica which had been both sides L>R for the last few weeks, but she had an injection done on her left side that improved that pain. Since then she has continued to have some mid low back pain, not radiating down her R leg but today had some R sided flank pain she was concerned might be related to her sciatica. She also reports some increased urinary frequency and was concerned about a UTI. She denies any fever, N/V/D. She has never had any abdominal surgeries before.   She also reports increased anxiety in the last 3 days after she ran out of her daily xanax. She is planning to see her PCP for a refill on that tomorrow.    Past Medical History:  Diagnosis Date  . Anxiety   . GERD (gastroesophageal reflux disease)     Past Surgical History:  Procedure Laterality Date  . R shoulder surgery    . TUBAL LIGATION     27 years ago    Family History  Problem Relation Age of Onset  . Depression Son   . Alcohol abuse Son     Social History   Tobacco Use  . Smoking status: Former Games developer  . Smokeless tobacco: Never Used  Substance Use Topics  . Alcohol use: No  . Drug use: No     Home Medications Prior to Admission medications   Medication Sig Start Date End Date Taking? Authorizing Provider  acetaminophen (TYLENOL) 500 MG tablet Take 500 mg by mouth every 6 (six) hours as needed for mild pain or moderate pain.   Yes [provider]  alendronate (FOSAMAX) 70 MG tablet Take 70 mg by mouth every Monday.  06/27/19  Yes [provider]  ALPRAZolam Prudy Feeler) 0.5 MG tablet Take 1 tablet (0.5 mg total) by mouth at bedtime as needed for anxiety. Patient taking differently: Take 0.5 mg by mouth 2 (two) times daily as needed for  anxiety.  09/14/16  Yes Neese, Hope M, NP  amLODipine (NORVASC) 2.5 MG tablet Take 2.5 mg by mouth daily as needed (high blood pressure).  03/09/20  Yes [provider]  Black Elderberry 50 MG/5ML SYRP Take 15 mLs by mouth daily.   Yes [provider]  HYDROcodone-acetaminophen (NORCO/VICODIN) 5-325 MG tablet Take 1 tablet by mouth 2 (two) times daily as needed for severe pain (for arthritis pain).  03/30/18  Yes [provider]  Multiple Vitamins-Minerals (CENTRUM SILVER 50+WOMEN) TABS Take 1 tablet by mouth daily with breakfast.   Yes [provider]  docusate sodium (COLACE) 100 MG capsule Take 1 capsule (100 mg total) by mouth every 12 (twelve) hours. Patient not taking: Reported on 09/14/2019 08/18/17   Alvino Chapel [place], PA-C  HYDROcodone-acetaminophen (NORCO/VICODIN) 5-325 MG tablet Take 1 tablet by mouth every 6 (six) hours as needed for moderate pain or severe pain. Patient not taking: Reported on 03/11/2020 11/18/19   Arby Barrette, MD  ondansetron (ZOFRAN ODT) 4 MG disintegrating tablet Take 1 tablet (4 mg total) by mouth every 8 (eight) hours as needed for nausea or vomiting. Patient not taking: Reported on 03/11/2020 11/18/19   Arby Barrette, MD  ondansetron (ZOFRAN) 4 MG tablet Take 1 tablet (4 mg total) by mouth every 8 (eight)  hours as needed for nausea or vomiting. Patient not taking: Reported on 09/14/2019 08/27/19   Michela Pitcher A, PA-C  polyethylene glycol (MIRALAX / GLYCOLAX) packet Take 17 g by mouth daily. Patient not taking: Reported on 08/27/2019 08/18/17   Alvino Chapel [place], PA-C     Allergies    Amoxicillin   Review of Systems   Review of Systems A comprehensive review of systems was completed and negative except as noted in HPI.    Physical Exam BP (!) 149/84 (BP Location: Left Arm)   Pulse (!) 102   Temp 98.9 F (37.2 C)   Resp 18   SpO2 99%   Physical Exam Vitals and nursing note reviewed.  Constitutional:      Appearance: Normal  appearance.  HENT:     Head: Normocephalic and atraumatic.     Nose: Nose normal.     Mouth/Throat:     Mouth: Mucous membranes are moist.  Eyes:     Extraocular Movements: Extraocular movements intact.     Conjunctiva/sclera: Conjunctivae normal.  Cardiovascular:     Rate and Rhythm: Normal rate.  Pulmonary:     Effort: Pulmonary effort is normal.     Breath sounds: Normal breath sounds.  Abdominal:     General: Abdomen is flat.     Palpations: Abdomen is soft.     Tenderness: There is no abdominal tenderness. There is no guarding. Negative signs include Murphy's sign and McBurney's sign.  Musculoskeletal:        General: No swelling. Normal range of motion.     Cervical back: Neck supple.  Skin:    General: Skin is warm and dry.  Neurological:     General: No focal deficit present.     Mental Status: She is alert.  Psychiatric:        Mood and Affect: Mood normal.      ED Results / Procedures / Treatments   Labs (all labs ordered are listed, but only abnormal results are displayed) Labs Reviewed  URINALYSIS, ROUTINE W REFLEX MICROSCOPIC - Abnormal; Notable for the following components:      Result Value   Color, Urine COLORLESS (*)    Specific Gravity, Urine 1.002 (*)    Leukocytes,Ua SMALL (*)    Bacteria, UA RARE (*)    All other components within normal limits  LIPASE, BLOOD  COMPREHENSIVE METABOLIC PANEL  CBC    EKG None  Radiology No results found.  Procedures Procedures  Medications Ordered in the ED Medications  HYDROcodone-acetaminophen (NORCO/VICODIN) 5-325 MG per tablet 1 tablet (has no administration in time range)     MDM Rules/Calculators/A&P MDM Patient with nonspecific abdominal/flank pain. Normal exam. Labs done in triage reviewed and normal including UA, CBC and CMP/Lipase. No concern for acute surgical intra-abdominal process in need of advanced imaging. Review of the patient's chart shows she had has occasional ED visits for  similar symptoms over the last year. Review of PDMP database shows she filled a 7 day Rx (15tabs) of Xanax on 11/23.  ED Course  I have reviewed the triage vital signs and the nursing notes.  Pertinent labs & imaging results that were available during my care of the patient were reviewed by me and considered in my medical decision making (see chart for details).  Clinical Course as of Mar 12 1715  Wynelle Link Mar 11, 2020  1714 Discussed results with patient. Recommend close outpatient follow up with PCP for recheck. She is requesting a dose of pain  meds before she goes.    [CS]    Clinical Course User Index [CS] Pollyann Savoy, MD    Final Clinical Impression(s) / ED Diagnoses Final diagnoses:  Right flank pain  Anxiety    Rx / DC Orders ED Discharge Orders    None       Pollyann Savoy, MD 03/11/20 [phone]

## 2020-05-03 ENCOUNTER — Encounter (HOSPITAL_COMMUNITY): Payer: Self-pay

## 2020-05-03 ENCOUNTER — Other Ambulatory Visit: Payer: Self-pay

## 2020-05-03 ENCOUNTER — Emergency Department (HOSPITAL_COMMUNITY)
Admission: EM | Admit: 2020-05-03 | Discharge: 2020-05-03 | Disposition: A | Discharge status: Home / Self Care | Payer: Medicare Other | Attending: Emergency Medicine | Admitting: Emergency Medicine

## 2020-05-03 DIAGNOSIS — Z760 Encounter for issue of repeat prescription: Secondary | ICD-10-CM

## 2020-05-03 DIAGNOSIS — Z87891 Personal history of nicotine dependence: Secondary | ICD-10-CM | POA: Diagnosis not present

## 2020-05-03 DIAGNOSIS — F419 Anxiety disorder, unspecified: Secondary | ICD-10-CM | POA: Diagnosis not present

## 2020-05-03 MED ORDER — ALPRAZOLAM 0.25 MG PO TABS
0.5000 mg | ORAL_TABLET | Freq: Once | ORAL | Status: AC
  Administered 2020-05-03: 0.5 mg via ORAL
  Filled 2020-05-03: qty 2

## 2020-05-03 NOTE — ED Provider Notes (Signed)
[facility] EMERGENCY DEPARTMENT Provider Note   CSN: [id number] Arrival date & time: 05/03/20  5038     History Chief Complaint  Patient presents with  . Medication Refill    Darlene Curtis is a 80 y.o. female.  HPI Patient presents with anxiety and requesting medication refill.  She has been on chronic Xanax.  Her PCP had been giving her 0.5 mg tablets twice a day.  It appears that around 3 weeks ago that was changed to once a day.  Patient initially denied any change in prescription but states they were trying to cut her back.  States that she did have a panic attack on Sunday due to her power being out.  States that she took the last of her medicines.  States she has been about 2 and half days off the medicine as she is feeling somewhat anxious.  No suicidal homicidal thoughts.  No hallucinations.  States she was not able to get into see her doctor.  Dr. Roberts cannot see her till Monday.  Reviewing drug database she should still have another week of pills since they change it to once a day dosing from twice a day.    Past Medical History:  Diagnosis Date  . Anxiety   . GERD (gastroesophageal reflux disease)     Patient Active Problem List   Diagnosis Date Noted  . Partial small bowel obstruction (HCC) 11/12/2012  . Hyperkalemia 11/12/2012    Past Surgical History:  Procedure Laterality Date  . R shoulder surgery    . TUBAL LIGATION     27  years ago     OB History   No obstetric history on file.     Family History  Problem Relation Age of Onset  . Depression Son   . Alcohol abuse Son     Social History   Tobacco Use  . Smoking status: Former  . Smokeless tobacco: Never Used  Substance Use Topics  . Alcohol use: No  . Drug use: No    Home Medications Prior to Admission medications   Medication Sig Start Date End Date Taking? Authorizing Provider  acetaminophen (TYLENOL) 500 MG tablet Take 500 mg by mouth every 6 (six)  hours as needed for mild pain or moderate pain.    [provider]  alendronate (FOSAMAX) 70 MG tablet Take 70 mg by mouth every Monday.  06/27/19   [provider]  ALPRAZolam 06/29/19) 0.5 MG tablet Take 1 tablet (0.5 mg total) by mouth at bedtime as needed for anxiety. Patient taking differently: Take 0.5 mg by mouth 2 (two) times daily as needed for anxiety.  09/14/16   11/14/16, NP  amLODipine (NORVASC) 2.5 MG tablet Take 2.5 mg by mouth daily as needed (high blood pressure).  03/09/20   [provider]  Black Elderberry 50 MG/5ML SYRP Take 15 mLs by mouth daily.    [provider]  docusate sodium (COLACE) 100 MG capsule Take 1 capsule (100 mg total) by mouth every 12 (twelve) hours. Patient not taking: Reported on 09/14/2019 08/18/17   10/18/17 Alvino Chapel, PA-C  HYDROcodone-acetaminophen (NORCO/VICODIN) 5-325 MG tablet Take 1 tablet by mouth 2 (two) times daily as needed for severe pain (for arthritis pain).  03/30/18   [provider]  HYDROcodone-acetaminophen (NORCO/VICODIN) 5-325 MG tablet Take 1 tablet by mouth every 6 (six) hours as needed for moderate pain or severe pain. Patient not taking: Reported on 03/11/2020 11/18/19   01/18/20,  MD  Multiple Vitamins-Minerals (CENTRUM SILVER 50+WOMEN) TABS Take 1 tablet by mouth daily with breakfast.    [provider]  ondansetron (ZOFRAN ODT) 4 MG disintegrating tablet Take 1 tablet (4 mg total) by mouth every 8 (eight) hours as needed for nausea or vomiting. Patient not taking: Reported on 03/11/2020 11/18/19   Arby Barrette, MD  ondansetron (ZOFRAN) 4 MG tablet Take 1 tablet (4 mg total) by mouth every 8 (eight) hours as needed for nausea or vomiting. Patient not taking: Reported on 09/14/2019 08/27/19   Michela Pitcher A, PA-C  polyethylene glycol (MIRALAX / GLYCOLAX) packet Take 17 g by mouth daily. Patient not taking: Reported on 08/27/2019 08/18/17   Alvino Chapel [place], PA-C    Allergies     Amoxicillin  Review of Systems   Review of Systems  Constitutional: Negative for appetite change.  HENT: Negative for congestion.   Cardiovascular: Negative for chest pain.  Gastrointestinal: Negative for abdominal pain.  Musculoskeletal: Negative for arthralgias.  Skin: Negative for wound.  Neurological: Negative for weakness.  Psychiatric/Behavioral: Negative for dysphoric mood and hallucinations. The patient is nervous/anxious. The patient is not hyperactive.     Physical Exam Updated Vital Signs BP (!) 164/89 (BP Location: Right Arm)   Pulse 94   Temp 98.6 F (37 C) (Oral)   Resp 18   Ht 5\' 2"  (1.575 m)   Wt 63.5 kg   SpO2 100%   BMI 25.61 kg/m   Physical Exam Vitals and nursing note reviewed.  HENT:     Head: Normocephalic.  Cardiovascular:     Rate and Rhythm: Regular rhythm.  Pulmonary:     Breath sounds: No wheezing.  Abdominal:     Tenderness: There is no abdominal tenderness.  Musculoskeletal:     Cervical back: Neck supple.  Skin:    General: Skin is warm.  Neurological:     Mental Status: She is alert and oriented to person, place, and time.  Psychiatric:        Mood and Affect: Mood normal.     ED Results / Procedures / Treatments   Labs (all labs ordered are listed, but only abnormal results are displayed) Labs Reviewed - No data to display  EKG None  Radiology No results found.  Procedures Procedures (including critical care time)  Medications Ordered in ED Medications  ALPRAZolam ) tablet 0.5 mg (0.5 mg Oral Given 05/03/20 1035)    ED Course  I have reviewed the triage vital signs and the nursing notes.  Pertinent labs & imaging results that were available during my care of the patient were reviewed by me and considered in my medical decision making (see chart for details).    MDM Rules/Calculators/A&P                          Patient presents with some anxiety hoping for refill on her Xanax.  PCP had decreased the  dose from twice a day to once a day.  With this patient ran out about a week early.  States she had a panic attack due to the heat being out at her house.  Has been off medicine around 2 and half days.  We will give a dose here but will not refill since this is a chronic medication.  Has follow-up on Monday with her PCP.  I think she is low risk for seizure or other pathology from coming off the medicine.  Will discharge home. Final  Clinical Impression(s) / ED Diagnoses Final diagnoses:  Anxiety  Medication refill    Rx / DC Orders ED Discharge Orders    None       Benjiman Core, MD 05/03/20 1047

## 2020-05-03 NOTE — ED Notes (Signed)
Patient states she got upset Sunday because her heat went out states she only had 3 pills left and wants a refill , Spoke with her Dr. Su Hilt and he can't see her until Monday,.

## 2020-05-03 NOTE — ED Triage Notes (Signed)
Pt reports she had a panic attack on Saturday of last week and is out of her medication ( Xanax) . Pt reports she needs a refill to help her not go through another attack,.

## 2020-09-19 ENCOUNTER — Ambulatory Visit (HOSPITAL_COMMUNITY)
Admission: EM | Admit: 2020-09-19 | Discharge: 2020-09-19 | Disposition: A | Discharge status: Home / Self Care | Payer: Medicare Other | Attending: Medical Oncology | Admitting: Medical Oncology

## 2020-09-19 ENCOUNTER — Encounter (HOSPITAL_COMMUNITY): Payer: Self-pay | Admitting: Emergency Medicine

## 2020-09-19 ENCOUNTER — Other Ambulatory Visit: Payer: Self-pay

## 2020-09-19 DIAGNOSIS — H8110 Benign paroxysmal vertigo, unspecified ear: Secondary | ICD-10-CM | POA: Diagnosis not present

## 2020-09-19 DIAGNOSIS — H6593 Unspecified nonsuppurative otitis media, bilateral: Secondary | ICD-10-CM

## 2020-09-19 MED ORDER — FLUTICASONE PROPIONATE 50 MCG/ACT NA SUSP
2.0000 | Freq: Every day | NASAL | 0 refills | Status: DC
Start: 2020-09-19 — End: 2022-09-10

## 2020-09-19 NOTE — ED Triage Notes (Signed)
Left ear fullness and has noticed dizziness with standing .  Patient reports this has been occurring for 3 days.

## 2020-09-19 NOTE — ED Provider Notes (Signed)
[facility]    CSN: [id number] Arrival date & time: 09/19/20  1824      History   Chief Complaint Chief Complaint  Patient presents with  . Dizziness  . Ear Fullness    HPI Darlene Curtis is a 80 y.o. female.   HPI   Ear fullness: Pt reports bilateral ear fullness along with mild dizziness when standing or when she moves her head in different directions. No head injury- attributes symptoms to sinus congestion. She has noticed these symptoms for the past 3 days. Dizziness resolves quickly on its own. She denies falls, visual changes, neuro changes such as weakness, chest pains, SOB, diaphoresis. She has not tried anything for symptoms.   Past Medical History:  Diagnosis Date  . Anxiety   . GERD (gastroesophageal reflux disease)     Patient Active Problem List   Diagnosis Date Noted  . Partial small bowel obstruction (HCC) 11/12/2012  . Hyperkalemia 11/12/2012    Past Surgical History:  Procedure Laterality Date  . R shoulder surgery    . TUBAL LIGATION     27 years ago    OB History   No obstetric history on file.      Home Medications    Prior to Admission medications   Medication Sig Start Date End Date Taking? Authorizing Provider  ALPRAZolam Prudy Feeler) 0.5 MG tablet Take 1 tablet (0.5 mg total) by mouth at bedtime as needed for anxiety. Patient taking differently: Take 0.5 mg by mouth 2 (two) times daily as needed for anxiety. 09/14/16  Yes Neese, Hope M, NP  HYDROcodone-acetaminophen (NORCO/VICODIN) 5-325 MG tablet Take 1 tablet by mouth 2 (two) times daily as needed for severe pain (for arthritis pain).  03/30/18  Yes [provider]  acetaminophen (TYLENOL) 500 MG tablet Take 500 mg by mouth every 6 (six) hours as needed for mild pain or moderate pain.    [provider]  alendronate (FOSAMAX) 70 MG tablet Take 70 mg by mouth every Monday.  06/27/19   [provider]  amLODipine (NORVASC) 2.5 MG tablet Take 2.5 mg by  mouth daily as needed (high blood pressure).  03/09/20   [provider]  Black Elderberry 50 MG/5ML SYRP Take 15 mLs by mouth daily.    [provider]  docusate sodium (COLACE) 100 MG capsule Take 1 capsule (100 mg total) by mouth every 12 (twelve) hours. Patient not taking: Reported on 09/14/2019 08/18/17   Alvino Chapel [place], PA-C  HYDROcodone-acetaminophen (NORCO/VICODIN) 5-325 MG tablet Take 1 tablet by mouth every 6 (six) hours as needed for moderate pain or severe pain. Patient not taking: Reported on 03/11/2020 11/18/19   Arby Barrette, MD  Multiple Vitamins-Minerals (CENTRUM SILVER 50+WOMEN) TABS Take 1 tablet by mouth daily with breakfast.    [provider]  ondansetron (ZOFRAN ODT) 4 MG disintegrating tablet Take 1 tablet (4 mg total) by mouth every 8 (eight) hours as needed for nausea or vomiting. Patient not taking: Reported on 03/11/2020 11/18/19   Arby Barrette, MD  ondansetron (ZOFRAN) 4 MG tablet Take 1 tablet (4 mg total) by mouth every 8 (eight) hours as needed for nausea or vomiting. Patient not taking: Reported on 09/14/2019 08/27/19   Michela Pitcher A, PA-C  polyethylene glycol (MIRALAX / GLYCOLAX) packet Take 17 g by mouth daily. Patient not taking: Reported on 08/27/2019 08/18/17   Rennis Harding, PA-C    Family History Family History  Problem Relation Age of Onset  . Depression Son   .  Alcohol abuse Son     Social History Social History   Tobacco Use  . Smoking status: Former Games developer  . Smokeless tobacco: Never Used  Vaping Use  . Vaping Use: Never used  Substance Use Topics  . Alcohol use: No  . Drug use: No     Allergies   Amoxicillin   Review of Systems Review of Systems  As stated above in HPI Physical Exam Triage Vital Signs ED Triage Vitals  Enc Vitals Group     BP 09/19/20 1852 137/72     Pulse Rate 09/19/20 1852 85     Resp 09/19/20 1852 20     Temp 09/19/20 1852 98.8 F (37.1 C)     Temp Source 09/19/20 1852 Oral      SpO2 09/19/20 1852 100 %     Weight --      Height --      Head Circumference --      Peak Flow --      Pain Score 09/19/20 1849 0     Pain Loc --      Pain Edu? --      Excl. in GC? --    No data found.  Updated Vital Signs BP 137/72 (BP Location: Right Arm)   Pulse 85   Temp 98.8 F (37.1 C) (Oral)   Resp 20   SpO2 100%   Physical Exam Vitals and nursing note reviewed.  Constitutional:      General: She is not in acute distress.    Appearance: Normal appearance. She is not ill-appearing, toxic-appearing or diaphoretic.  HENT:     Head: Normocephalic and atraumatic.     Right Ear: Ear canal and external ear normal.     Left Ear: Ear canal and external ear normal.     Ears:     Comments: Moderate middle ear effusion bilaterally     Nose: Nose normal.     Mouth/Throat:     Mouth: Mucous membranes are moist.  Eyes:     Extraocular Movements: Extraocular movements intact.     Pupils: Pupils are equal, round, and reactive to light.  Neck:     Vascular: No carotid bruit.  Cardiovascular:     Rate and Rhythm: Normal rate and regular rhythm.     Heart sounds: Normal heart sounds.  Pulmonary:     Effort: Pulmonary effort is normal.     Breath sounds: Normal breath sounds.  Musculoskeletal:        General: Normal range of motion.     Cervical back: Normal range of motion and neck supple. No rigidity or tenderness.  Lymphadenopathy:     Cervical: No cervical adenopathy.  Skin:    General: Skin is warm.     Coloration: Skin is not pale.  Neurological:     General: No focal deficit present.     Mental Status: She is alert and oriented to person, place, and time.     Cranial Nerves: No cranial nerve deficit.     Sensory: No sensory deficit.     Motor: No weakness.     Coordination: Coordination normal.     Gait: Gait normal.     Deep Tendon Reflexes: Reflexes normal.  Psychiatric:        Mood and Affect: Mood normal.        Behavior: Behavior normal.        Thought  Content: Thought content normal.        Judgment:  Judgment normal.      UC Treatments / Results  Labs (all labs ordered are listed, but only abnormal results are displayed) Labs Reviewed - No data to display  EKG   Radiology No results found.  Procedures Procedures (including critical care time)  Medications Ordered in UC Medications - No data to display  Initial Impression / Assessment and Plan / UC Course  I have reviewed the triage vital signs and the nursing notes.  Pertinent labs & imaging results that were available during my care of the patient were reviewed by me and considered in my medical decision making (see chart for details).     New.  Appears to be related to her middle ear effusion and potential BPPV which I discussed with patient and she agrees.  Treating with Flonase at this time as her symptoms are rated as mild and given that meclizine is not ideal for geriatric patients.  We discussed how to use along with common potential side effects and precautions.  Discussed red flag signs and symptoms.  She has a follow-up physical scheduled on Monday with her PCP. Final Clinical Impressions(s) / UC Diagnoses   Final diagnoses:  None   Discharge Instructions   None    ED Prescriptions    None     PDMP not reviewed this encounter.   Rushie Chestnut, [place] 09/19/20 Windell Moment

## 2021-01-01 ENCOUNTER — Ambulatory Visit (HOSPITAL_COMMUNITY)
Admission: EM | Admit: 2021-01-01 | Discharge: 2021-01-01 | Disposition: A | Discharge status: Home / Self Care | Payer: Medicare Other | Attending: Psychiatry | Admitting: Psychiatry

## 2021-01-01 ENCOUNTER — Other Ambulatory Visit: Payer: Self-pay

## 2021-01-01 DIAGNOSIS — F411 Generalized anxiety disorder: Secondary | ICD-10-CM | POA: Insufficient documentation

## 2021-01-01 DIAGNOSIS — Z79899 Other long term (current) drug therapy: Secondary | ICD-10-CM | POA: Insufficient documentation

## 2021-01-01 DIAGNOSIS — F32A Depression, unspecified: Secondary | ICD-10-CM | POA: Insufficient documentation

## 2021-01-01 NOTE — ED Provider Notes (Signed)
[facility] Medical Screening Exam  Patient Name: Darlene Curtis MRN: [id number] Date of Evaluation: 01/01/21 Chief Complaint:   Diagnosis:  Final diagnoses:  GAD (generalized anxiety disorder)    History of Present illness: Darlene Curtis is a 80 y.o. female patient presented to [facility] as a walk in accompanied by [name] with complaints of "My daughter wanted me to be evaluated, I don't know why she had the police bring me".  Patient denies any official psychiatric diagnosis.  States she has never seen a therapist.  Her PCP currently prescribes her Xanax 1 mg twice daily to take when she feels "anxious".  Darlene Curtis, 80 y.o., female patient seen face to face by this provider, consulted with Dr. Bronwen Betters; and chart reviewed on 01/01/21.   On evaluation Darlene Curtis reports over the past few months her depression and anxiety has increased.  States she has crying spells, and decreased appetite. States she has lost roughly 10 pounds.  The only trigger patient can identify is family dynamics. She becomes tearful at times. States she has a couple of daughters and grandchildren.  Reports her daughters accused her of not being fair.  States one of her daughters gets upset because she lets the grown grandchildren stay with her.  On evaluation patient is in sitting position in no acute distress.  She is alert and oriented x4.  She makes good contact.  She is anxious.  Endorses depression with congruent affect.  Her speech is clear, coherent, normal rate and tone. She is well spoken. She is well-groomed.  Her thought process is coherent and relevant.  There is no indication that she is currently responding to internal/external stimuli.  She denies suicidal/homicidal ideation.  Patient contracts for safety.  Denies access to firearms/weapons.  Denies auditory and visual hallucinations.  Denies paranoia and delusional thought.  Collateral was obtained by Delvondria Debose  [facility]. Per chart "patient's daughter expressed concern with her mother never been diagnosed with a mental illness and her PCP prescribing her same medications for years".  Daughter reports she made the patient appointment with a psychiatrist,  [facility] on 10/6.    At this time Darlene Curtis is educated and verbalizes understanding of mental health resources and other crisis services in the community.  She is instructed to call 911 and present to the nearest emergency room should she experience any suicidal/homicidal ideation, auditory/visual/hallucinations, or detrimental worsening of her mental health condition.     Psychiatric Specialty Exam  Presentation  General Appearance:Casual; Well Groomed  Eye Contact:Good  Speech:Clear and Coherent; Normal Rate  Speech Volume:Normal  Handedness:Right   Mood and Affect  Mood:Anxious; Depressed  Affect:Congruent   Thought Process  Thought Processes:Coherent  Descriptions of Associations:Intact  Orientation:Full (Time, Place and Person)  Thought Content:Logical    Hallucinations:None  Ideas of Reference:None  Suicidal Thoughts:No  Homicidal Thoughts:No   Sensorium  Memory:Immediate Good; Recent Good; Remote Good  Judgment:Good  Insight:Good   Executive Functions  Concentration:Good  Attention Span:Good  Recall:Good  Fund of Knowledge:Good  Language:Good   Psychomotor Activity  Psychomotor Activity:Normal   Assets  Assets:Communication Skills; Desire for Improvement; Housing; Health and safety inspector; Physical Health; Resilience; Transportation; Social Support; Leisure Time   Sleep  Sleep:Good  Number of hours:  No data recorded  No data recorded  Physical Exam: Physical Exam Vitals and nursing note reviewed.  Constitutional:      General: She is not in acute distress.    Appearance: Normal appearance.  She is not ill-appearing.  HENT:     Head: Normocephalic.  Eyes:     General:         Right eye: No discharge.        Left eye: No discharge.     Conjunctiva/sclera: Conjunctivae normal.  Cardiovascular:     Rate and Rhythm: Normal rate.  Pulmonary:     Effort: Pulmonary effort is normal. No respiratory distress.  Musculoskeletal:        General: Normal range of motion.     Cervical back: Normal range of motion.  Skin:    Coloration: Skin is not jaundiced or pale.  Neurological:     Mental Status: She is alert and oriented to person, place, and time.  Psychiatric:        Attention and Perception: Attention and perception normal.        Mood and Affect: Mood is anxious and depressed. Affect is tearful.        Speech: Speech normal.        Behavior: Behavior is cooperative.        Thought Content: Thought content normal.        Cognition and Memory: Cognition normal.        Judgment: Judgment is impulsive.   Review of Systems  Constitutional: Negative.   HENT: Negative.    Eyes: Negative.   Respiratory: Negative.  Negative for cough.   Cardiovascular: Negative.   Musculoskeletal: Negative.   Skin: Negative.   Neurological: Negative.  Negative for headaches.  Psychiatric/Behavioral:  Positive for depression. The patient is nervous/anxious.   Blood pressure (!) 155/99, pulse (!) 102, temperature 98.5 F (36.9 C), temperature source Oral, resp. rate 16. There is no height or weight on file to calculate BMI.  Musculoskeletal: Strength & Muscle Tone: within normal limits Gait & Station: normal Patient leans: N/A   [facility] MSE Discharge Disposition for Follow up and Recommendations: Based on my evaluation the patient does not appear to have an emergency medical condition and can be discharged with resources and follow up care in outpatient services for Medication Management.  Discharge patient.  Patient has a follow-up appointment with [facility] on 01/17/2021 for psychiatrist and therapy services.  No evidence of imminent risk to self or others at present.     Patient does not meet criteria for psychiatric inpatient admission. Discussed crisis plan, support from social network, calling 911, coming to the Emergency Department, and calling Suicide Hotline.   Ardis Hughs, NP 01/01/2021, 9:47 PM

## 2021-01-01 NOTE — Discharge Instructions (Addendum)

## 2021-01-01 NOTE — Progress Notes (Signed)
   01/01/21 1204  [facility] Triage Screening (Walk-ins at [facility] only)  How Did You Hear About [place]? Family/Friend  What Is the Reason for Your Visit/Call Today? 81 year old present to [facility] accompanied by [name]. Pt report her daughter requested she come to the hospital to speak with someone. Pt report depressive symptoms, crying spills, decreased eating, and increased anxiety. Patient is prescribed xanax by her PCP. Denied suicidal/homicidal ideations and denied auditory/visual hallucination. Denied feelings of paranoia. Report feeling overwhelmed due to family dynamics. Provided no trigger for depression that been present for years. Collateral: TTS spoke with patient's daughter who expressed concern with her mother never been diagnosed with a mental health illness and her PCP prescribing her the same medication for years. Reported she has contacted [facility] and her mother has an appt. there 01/17/2021 @ 2pm (evaluation for psychiatrist and therapy services). Denied additional concerns  How Long Has This Been Causing You Problems? > than 6 months  Have You Recently Had Any Thoughts About Hurting Yourself? No  Are You Planning to Commit Suicide/Harm Yourself At This time? No  Have you Recently Had Thoughts About Hurting Someone [name]? No  Are You Planning To Harm Someone At This Time? No  Are you currently experiencing any auditory, visual or other hallucinations? No  Have You Used Any Alcohol or Drugs in the Past 24 Hours? No  Do you have any current medical co-morbidities that require immediate attention? No  Clinician description of patient physical appearance/behavior: well groomed, tearful during Triage, dressed appropriately for the weather.  What Do You Feel Would Help You the Most Today? Medication(s);Stress Management  If access to [facility] was not available, would you have sought care in the Emergency Department? Yes  Determination of Need Routine (7 days)  Options For Referral Medication  Management;Outpatient Therapy

## 2021-01-01 NOTE — Discharge Summary (Signed)
Patient reported that she is feeling better. She was picked up by her daughter. AVS was reviewed and resources were reviewed. Patient left at 1:38 pm with her daughter.

## 2021-01-27 ENCOUNTER — Other Ambulatory Visit: Payer: Self-pay

## 2021-01-27 ENCOUNTER — Ambulatory Visit (HOSPITAL_COMMUNITY)
Admission: EM | Admit: 2021-01-27 | Discharge: 2021-01-27 | Disposition: A | Discharge status: Home / Self Care | Payer: Medicare Other | Attending: Emergency Medicine | Admitting: Emergency Medicine

## 2021-01-27 ENCOUNTER — Encounter (HOSPITAL_COMMUNITY): Payer: Self-pay | Admitting: Emergency Medicine

## 2021-01-27 DIAGNOSIS — F419 Anxiety disorder, unspecified: Secondary | ICD-10-CM

## 2021-01-27 DIAGNOSIS — I10 Essential (primary) hypertension: Secondary | ICD-10-CM

## 2021-01-27 NOTE — Discharge Instructions (Addendum)
Rest and drink fluids Eat a well-balanced diet, and avoid excessive caffeine intake Continue to take lorazepam as prescribed for anxiety Continue to take tramadol for severe pain May take OTC Tylenol 500 mg as needed for headache Follow-up with PCP tomorrow to reevaluate BP medications Check blood pressure at home if it is continuously above 160/80 go to ER Some things you may try doing to help alleviate your  anxiety symptoms include: keeping a journal, exercise, talking to a friend or relative, listening to music, going for a walk or hike outside, or other activities that you may find enjoyable PCP assistance initiated Recommending further evaluation and management with PCP Return or go to ER if you have any new or worsening symptoms such as fever, chills, fatigue, worsening shortness of breath, wheezing, chest pain, nausea, vomiting, abdominal pain, changes in bowel or bladder habits, etc..Marland Kitchen

## 2021-01-27 NOTE — ED Triage Notes (Signed)
PT currently taking lorazepam 0.5 mg ; written for use BID; filled quantity of 60 on 01/15/21; has bottle with her.

## 2021-01-27 NOTE — ED Provider Notes (Signed)
[facility]   [id number] 01/27/21 Arrival Time: 1004   Chief Complaint  Patient presents with   Anxiety     SUBJECTIVE: History from: patient.  Darlene Curtis is a 80 y.o. female who presents to the urgent care with a complaint of anxiety for the past few days.  Report he has been seen at [facility] few days ago and was prescribed lorazepam 0.5 mg twice daily and tramadol for pain.  Has an appointment coming up tomorrow with a PCP.  Describes anxiety as as feeling scare.  Denies any alleviating or aggravating factors.  Reports similar symptoms in the past that improved with Xanax.  Denies HI or SI.  Patient denies fever, chills, anhedonia, difficulty sleeping, changes in normal activities, nausea, vomiting, chest pain, SOB, abdominal pain, changes in bowel or bladder habits.        ROS: As per HPI.  All other pertinent ROS negative.     Past Medical History:  Diagnosis Date   Anxiety    GERD (gastroesophageal reflux disease)    Past Surgical History:  Procedure Laterality Date   R shoulder surgery     TUBAL LIGATION     27 years ago   Allergies  Allergen Reactions   Amoxicillin Other (See Comments)    Does not work for patient. "my body had become immune"   No current facility-administered medications on file prior to encounter.   Current Outpatient Medications on File Prior to Encounter  Medication Sig Dispense Refill   alendronate (FOSAMAX) 70 MG tablet Take 70 mg by mouth every Monday.      ALPRAZolam (XANAX) 0.5 MG tablet Take 1 tablet (0.5 mg total) by mouth at bedtime as needed for anxiety. (Patient taking differently: Take 0.5 mg by mouth 2 (two) times daily as needed for anxiety.) 10 tablet 0   amLODipine (NORVASC) 2.5 MG tablet Take 2.5 mg by mouth daily as needed (high blood pressure).      HYDROcodone-acetaminophen (NORCO/VICODIN) 5-325 MG tablet Take 1 tablet by mouth 2 (two) times daily as needed for severe pain (for arthritis pain).       acetaminophen (TYLENOL) 500 MG tablet Take 500 mg by mouth every 6 (six) hours as needed for mild pain or moderate pain.     Black Elderberry 50 MG/5ML SYRP Take 15 mLs by mouth daily.     docusate sodium (COLACE) 100 MG capsule Take 1 capsule (100 mg total) by mouth every 12 (twelve) hours. (Patient not taking: Reported on 09/14/2019) 60 capsule 0   fluticasone (FLONASE) 50 MCG/ACT nasal spray Place 2 sprays into both nostrils daily. 16 mL 0   HYDROcodone-acetaminophen (NORCO/VICODIN) 5-325 MG tablet Take 1 tablet by mouth every 6 (six) hours as needed for moderate pain or severe pain. (Patient not taking: Reported on 03/11/2020) 12 tablet 0   Multiple Vitamins-Minerals (CENTRUM SILVER 50+WOMEN) TABS Take 1 tablet by mouth daily with breakfast.     ondansetron (ZOFRAN ODT) 4 MG disintegrating tablet Take 1 tablet (4 mg total) by mouth every 8 (eight) hours as needed for nausea or vomiting. (Patient not taking: Reported on 03/11/2020) 2 tablet 0   ondansetron (ZOFRAN) 4 MG tablet Take 1 tablet (4 mg total) by mouth every 8 (eight) hours as needed for nausea or vomiting. (Patient not taking: Reported on 09/14/2019) 10 tablet 0   polyethylene glycol (MIRALAX / GLYCOLAX) packet Take 17 g by mouth daily. (Patient not taking: Reported on 08/27/2019) 14 each 0   Social  History   Socioeconomic History   Marital status: Widowed    Spouse name: Not on file   Number of children: Not on file   Years of education: Not on file   Highest education level: Not on file  Occupational History   Not on file  Tobacco Use   Smoking status: Former   Smokeless tobacco: Never  Vaping Use   Vaping Use: Never used  Substance and Sexual Activity   Alcohol use: No   Drug use: No   Sexual activity: Not on file  Other Topics Concern   Not on file  Social History Narrative   Not on file   Social Determinants of Health   Financial Resource Strain: Not on file  Food Insecurity: Not on file  Transportation Needs: Not  on file  Physical Activity: Not on file  Stress: Not on file  Social Connections: Not on file  Intimate Partner Violence: Not on file   Family History  Problem Relation Age of Onset   Depression Son    Alcohol abuse Son     OBJECTIVE:  Vitals:   01/27/21 1044 01/27/21 1121  BP: (!) 180/95 (!) 160/99  Pulse: 86   Resp: 16   Temp: 98.4 F (36.9 C)   TempSrc: Oral   SpO2: 98%     Physical Exam Vitals and nursing note reviewed.  Constitutional:      General: She is not in acute distress.    Appearance: Normal appearance. She is normal weight. She is not ill-appearing or toxic-appearing.  HENT:     Head: Normocephalic.     Right Ear: Tympanic membrane, ear canal and external ear normal. There is no impacted cerumen.     Left Ear: Tympanic membrane, ear canal and external ear normal. There is no impacted cerumen.     Nose: Nose normal. No congestion.     Mouth/Throat:     Mouth: Mucous membranes are moist.     Pharynx: Oropharynx is clear. No oropharyngeal exudate or posterior oropharyngeal erythema.  Cardiovascular:     Rate and Rhythm: Normal rate and regular rhythm.     Pulses: Normal pulses.     Heart sounds: Normal heart sounds. No murmur heard. Pulmonary:     Effort: Pulmonary effort is normal. No respiratory distress.     Breath sounds: Normal breath sounds. No wheezing or rhonchi.  Chest:     Chest wall: No tenderness.  Abdominal:     General: Abdomen is flat. Bowel sounds are normal. There is no distension.     Palpations: There is no mass.     Tenderness: There is no abdominal tenderness.  Skin:    Capillary Refill: Capillary refill takes less than 2 seconds.  Neurological:     General: No focal deficit present.     Mental Status: She is alert and oriented to person, place, and time.     GCS: GCS eye subscore is 4. GCS verbal subscore is 5. GCS motor subscore is 6.     Cranial Nerves: Cranial nerves are intact. No cranial nerve deficit or facial asymmetry.      Sensory: Sensation is intact.     Motor: Motor function is intact. No weakness, tremor, atrophy, abnormal muscle tone, seizure activity or pronator drift.     Coordination: Coordination is intact.     Gait: Gait is intact.     Deep Tendon Reflexes:     Reflex Scores:      Achilles reflexes are  2+ on the right side and 2+ on the left side.    LABS:  No results found for this or any previous visit (from the past 24 hour(s)).   ASSESSMENT & PLAN:  1. Anxiety   2. Essential hypertension     No orders of the defined types were placed in this encounter.  Patient is stable at discharge.  Blood pressure recheck was 160/99.  She reports she take amlodipine 2.5 mg daily as needed and has taken 1 dose today.  She took 1 extra dose on amlodipine 2.5 mg in the office.  Her neuro exam is otherwise normal.  Discharge instructions  Rest and drink fluids Eat a well-balanced diet, and avoid excessive caffeine intake Continue to take lorazepam as prescribed for anxiety Continue to take tramadol for severe pain May take OTC Tylenol 500 mg as needed for headache Follow-up with PCP tomorrow to have BP meds reevaluated Check blood pressure at home if it is continuously above 160/80 go to ER Some things you may try doing to help alleviate your  anxiety symptoms include: keeping a journal, exercise, talking to a friend or relative, listening to music, going for a walk or hike outside, or other activities that you may find enjoyable PCP assistance initiated Recommending further evaluation and management with PCP Return or go to ER if you have any new or worsening symptoms such as fever, chills, fatigue, worsening shortness of breath, wheezing, chest pain, nausea, vomiting, abdominal pain, changes in bowel or bladder habits, etc...   Reviewed expectations re: course of current medical issues. Questions answered. Outlined signs and symptoms indicating need for more acute intervention. Patient  verbalized understanding. After Visit Summary given.          Durward Parcel, FNP 01/27/21 1137

## 2021-01-27 NOTE — ED Triage Notes (Signed)
PT reports she had a panic attack Wednesday. She was seen at [facility] clinic. States they gave her something "similar to xanax" that helped, but did not fully treat anxiety. She has an appointment with PCP tomorrow, but still has lingering anxiety today.

## 2021-06-08 ENCOUNTER — Emergency Department (HOSPITAL_COMMUNITY): Payer: Medicare Other

## 2021-06-08 ENCOUNTER — Other Ambulatory Visit: Payer: Self-pay

## 2021-06-08 ENCOUNTER — Encounter (HOSPITAL_COMMUNITY): Payer: Self-pay | Admitting: Emergency Medicine

## 2021-06-08 ENCOUNTER — Emergency Department (HOSPITAL_COMMUNITY)
Admission: EM | Admit: 2021-06-08 | Discharge: 2021-06-08 | Disposition: A | Discharge status: Home / Self Care | Payer: Medicare Other | Attending: Emergency Medicine | Admitting: Emergency Medicine

## 2021-06-08 DIAGNOSIS — R630 Anorexia: Secondary | ICD-10-CM | POA: Insufficient documentation

## 2021-06-08 DIAGNOSIS — R1013 Epigastric pain: Secondary | ICD-10-CM | POA: Diagnosis not present

## 2021-06-08 DIAGNOSIS — R110 Nausea: Secondary | ICD-10-CM | POA: Diagnosis not present

## 2021-06-08 DIAGNOSIS — Z79899 Other long term (current) drug therapy: Secondary | ICD-10-CM | POA: Insufficient documentation

## 2021-06-08 DIAGNOSIS — K219 Gastro-esophageal reflux disease without esophagitis: Secondary | ICD-10-CM

## 2021-06-08 DIAGNOSIS — K5790 Diverticulosis of intestine, part unspecified, without perforation or abscess without bleeding: Secondary | ICD-10-CM

## 2021-06-08 LAB — CBC WITH DIFFERENTIAL/PLATELET
Abs Immature Granulocytes: 0 10*3/uL (ref 0.00–0.07)
Basophils Absolute: 0 10*3/uL (ref 0.0–0.1)
Basophils Relative: 1 %
Eosinophils Absolute: 0 10*3/uL (ref 0.0–0.5)
Eosinophils Relative: 1 %
HCT: 41.9 % (ref 36.0–46.0)
Hemoglobin: 13.2 g/dL (ref 12.0–15.0)
Immature Granulocytes: 0 %
Lymphocytes Relative: 39 %
Lymphs Abs: 1.5 10*3/uL (ref 0.7–4.0)
MCH: 28.1 pg (ref 26.0–34.0)
MCHC: 31.5 g/dL (ref 30.0–36.0)
MCV: 89.1 fL (ref 80.0–100.0)
Monocytes Absolute: 0.3 10*3/uL (ref 0.1–1.0)
Monocytes Relative: 8 %
Neutro Abs: 1.9 10*3/uL (ref 1.7–7.7)
Neutrophils Relative %: 51 %
Platelets: 242 10*3/uL (ref 150–400)
RBC: 4.7 MIL/uL (ref 3.87–5.11)
RDW: 13.8 % (ref 11.5–15.5)
WBC: 3.7 10*3/uL — ABNORMAL LOW (ref 4.0–10.5)
nRBC: 0 % (ref 0.0–0.2)

## 2021-06-08 LAB — COMPREHENSIVE METABOLIC PANEL
ALT: 14 U/L (ref 0–44)
AST: 19 U/L (ref 15–41)
Albumin: 3.9 g/dL (ref 3.5–5.0)
Alkaline Phosphatase: 87 U/L (ref 38–126)
Anion gap: 8 (ref 5–15)
BUN: 7 mg/dL — ABNORMAL LOW (ref 8–23)
CO2: 27 mmol/L (ref 22–32)
Calcium: 9.3 mg/dL (ref 8.9–10.3)
Chloride: 104 mmol/L (ref 98–111)
Creatinine, Ser: 0.95 mg/dL (ref 0.44–1.00)
GFR, Estimated: >60 mL/min (ref >60)
Glucose, Bld: 91 mg/dL (ref 70–99)
Potassium: 3.7 mmol/L (ref 3.5–5.1)
Sodium: 139 mmol/L (ref 135–145)
Total Bilirubin: 0.3 mg/dL (ref 0.3–1.2)
Total Protein: 6.7 g/dL (ref 6.5–8.1)

## 2021-06-08 LAB — URINALYSIS, ROUTINE W REFLEX MICROSCOPIC
Bilirubin Urine: NEGATIVE
Glucose, UA: NEGATIVE mg/dL
Hgb urine dipstick: NEGATIVE
Ketones, ur: NEGATIVE mg/dL
Nitrite: NEGATIVE
Protein, ur: NEGATIVE mg/dL
Specific Gravity, Urine: 1.009 (ref 1.005–1.030)
pH: 5 (ref 5.0–8.0)

## 2021-06-08 LAB — LIPASE, BLOOD: Lipase: 32 U/L (ref 11–51)

## 2021-06-08 LAB — TROPONIN I (HIGH SENSITIVITY)
Troponin I (High Sensitivity): 3 ng/L (ref <18)
Troponin I (High Sensitivity): 3 ng/L (ref <18)

## 2021-06-08 MED ORDER — OMEPRAZOLE 20 MG PO CPDR: 20.0000 mg | DELAYED_RELEASE_CAPSULE | Freq: Every day | ORAL | 0 refills | Status: DC

## 2021-06-08 MED ORDER — LIDOCAINE VISCOUS HCL 2 % MT SOLN
15.0000 mL | Freq: Once | OROMUCOSAL | Status: AC
  Administered 2021-06-08: 15 mL via ORAL
  Filled 2021-06-08: qty 15

## 2021-06-08 MED ORDER — POLYETHYLENE GLYCOL 3350 17 G PO PACK: 17.0000 g | PACK | Freq: Every day | ORAL | 0 refills | Status: DC

## 2021-06-08 MED ORDER — PANTOPRAZOLE SODIUM 40 MG IV SOLR
40.0000 mg | Freq: Once | INTRAVENOUS | Status: AC
  Administered 2021-06-08: 40 mg via INTRAVENOUS
  Filled 2021-06-08: qty 10

## 2021-06-08 MED ORDER — ALUM & MAG HYDROXIDE-SIMETH 200-200-20 MG/5ML PO SUSP
30.0000 mL | Freq: Once | ORAL | Status: AC
  Administered 2021-06-08: 30 mL via ORAL
  Filled 2021-06-08: qty 30

## 2021-06-08 MED ORDER — IOHEXOL 300 MG/ML  SOLN
100.0000 mL | Freq: Once | INTRAMUSCULAR | Status: AC | PRN
  Administered 2021-06-08: 100 mL via INTRAVENOUS

## 2021-06-08 MED ORDER — MAALOX MAX 400-400-40 MG/5ML PO SUSP: 10.0000 mL | Freq: Four times a day (QID) | ORAL | 0 refills | Status: AC | PRN

## 2021-06-08 MED ORDER — ONDANSETRON HCL 4 MG/2ML IJ SOLN
4.0000 mg | Freq: Once | INTRAMUSCULAR | Status: AC
Start: 2021-06-08 — End: 2021-06-08
  Administered 2021-06-08: 4 mg via INTRAVENOUS
  Filled 2021-06-08: qty 2

## 2021-06-08 MED ORDER — ONDANSETRON HCL 4 MG PO TABS: 4.0000 mg | ORAL_TABLET | Freq: Three times a day (TID) | ORAL | 0 refills | Status: DC | PRN

## 2021-06-08 NOTE — ED Provider Notes (Signed)
I received this patient in handoff.  Please see Valetta Mole, PA-Cs note for full history.  In short patient is a 81 year old female who presented today with upper abdominal pain for 2 days.  She has not tried to treat this over-the-counter.  Says that her appetite has decreased but she continues to have normal bowel movements.  No associated nausea or vomiting.  No history of abdominal surgeries.   Lab work is without abnormalities.  Urinalysis with leukocytes and WBCs however patient does not have urinary symptoms.  Plan is for me to follow up on CTAP and dispo accordingly.  Physical Exam  BP (!) 151/87 (BP Location: Left Arm)    Pulse 84    Temp 97.7 F (36.5 C) (Oral)    Resp 15    SpO2 100%   Physical Exam Vitals and nursing note reviewed.  Constitutional:      Appearance: Normal appearance. She is not ill-appearing.  HENT:     Head: Normocephalic and atraumatic.  Eyes:     General: No scleral icterus.    Conjunctiva/sclera: Conjunctivae normal.  Pulmonary:     Effort: Pulmonary effort is normal. No respiratory distress.  Abdominal:     Tenderness: There is abdominal tenderness in the epigastric area.  Skin:    Findings: No rash.  Neurological:     Mental Status: She is alert.  Psychiatric:        Mood and Affect: Mood normal.    Procedures  Procedures  ED Course / MDM   Clinical Course as of 06/08/21 1506  Sat Jun 08, 2021  1434 Leukocytes,Ua(!): LARGE [JS]  1506 WBC, UA: 21-50 [JS]    Clinical Course User Index [JS] Claude Manges, PA-C   Medical Decision Making Amount and/or Complexity of Data Reviewed Labs: ordered. Decision-making details documented in ED Course. Radiology: ordered.  Risk OTC drugs. Prescription drug management.  When I first evaluated the patient, she says that the GI cocktail helped her but her symptoms are returning.  She localizes her discomfort to the epigastrium, stating that it is burning.  I ordered pantoprazole and Zofran and will  reassess the patient.  On reassessment patient reported feeling better..   CT abdomen pelvis viewed by me and all organs appear to be within normal limits.  Radiologist noted diverticulosis without diverticulitis.  Troponin negative.  Disposition: At this time I believe patient is stable for discharge home with information about dietary choices with acid reflux.  She has also been given omeprazole to trial until she can follow-up with her primary care provider.  She requested a refill of Zofran and MiraLAX so this was sent to her pharmacy.  Also given Maalox for her to use as needed.  All of the results and plan were discussed with the patient as well as her daughter via telephone.  They are agreeable to follow-up.  They have been given a referral to GI to use if she continues to have stomach problems.       Woodroe Chen 06/08/21 2108    Alvira Monday, MD 06/09/21 1015

## 2021-06-08 NOTE — ED Triage Notes (Signed)
Pt reports mid upper abd pain since Friday with mild nausea.  Denies vomiting, diarrhea, constipation, and urinary complaints.

## 2021-06-08 NOTE — ED Provider Notes (Signed)
[facility] EMERGENCY DEPARTMENT Provider Note   CSN: [id number] Arrival date & time: 06/08/21  1327     History  Chief Complaint  Patient presents with   Abdominal Pain    Darlene Curtis is a 81 y.o. female.  81 y.o female with a PMH of Anxiety presents to the ED with a chief complaint of upper abdominal pain that is been ongoing for the past 2 days.  Patient endorses a comfort to the epigastric region without any radiation.  She has not taken any medication for improvement in her symptoms.  She does report some decrease in oral intake, however has "force herself "to eat.  Her last bowel movement was this morning without any blood in her stool.  She does not have any recent changes in her diet, no sick contacts, no suspicious food intakes.  Denies any nausea, vomiting, diarrhea, urinary symptoms. No chest pain or shortness of breath.  The history is provided by the patient and medical records.  Abdominal Pain Pain location:  Epigastric Pain quality: aching   Pain radiates to:  Does not radiate Pain severity:  Mild Onset quality:  Gradual Duration:  2 days Timing:  Constant Progression:  Unchanged Chronicity:  New Context: not alcohol use, not awakening from sleep, not diet changes, not eating, not laxative use, not medication withdrawal, not recent illness and not suspicious food intake   Relieved by:  Nothing Ineffective treatments:  None tried Associated symptoms: anorexia   Associated symptoms: no chest pain, no fever, no nausea, no shortness of breath and no vomiting       Home Medications Prior to Admission medications   Medication Sig Start Date End Date Taking? Authorizing Provider  acetaminophen (TYLENOL) 500 MG tablet Take 500 mg by mouth every 6 (six) hours as needed for mild pain or moderate pain.    [provider]  alendronate (FOSAMAX) 70 MG tablet Take 70 mg by mouth every Monday.  06/27/19   [provider]   ALPRAZolam Prudy Feeler) 0.5 MG tablet Take 1 tablet (0.5 mg total) by mouth at bedtime as needed for anxiety. Patient taking differently: Take 0.5 mg by mouth 2 (two) times daily as needed for anxiety. 09/14/16   Janne Napoleon, NP  amLODipine (NORVASC) 2.5 MG tablet Take 2.5 mg by mouth daily as needed (high blood pressure).  03/09/20   [provider]  Black Elderberry 50 MG/5ML SYRP Take 15 mLs by mouth daily.    [provider]  docusate sodium (COLACE) 100 MG capsule Take 1 capsule (100 mg total) by mouth every 12 (twelve) hours. Patient not taking: Reported on 09/14/2019 08/18/17   Wurst, [place], PA-C  fluticasone [facility]) 50 MCG/ACT nasal spray Place 2 sprays into both nostrils daily. 09/19/20   Rushie Chestnut, PA-C  HYDROcodone-acetaminophen (NORCO/VICODIN) 5-325 MG tablet Take 1 tablet by mouth 2 (two) times daily as needed for severe pain (for arthritis pain).  03/30/18   [provider]  HYDROcodone-acetaminophen (NORCO/VICODIN) 5-325 MG tablet Take 1 tablet by mouth every 6 (six) hours as needed for moderate pain or severe pain. Patient not taking: Reported on 03/11/2020 11/18/19   Arby Barrette, MD  Multiple Vitamins-Minerals (CENTRUM SILVER 50+WOMEN) TABS Take 1 tablet by mouth daily with breakfast.    [provider]  ondansetron (ZOFRAN ODT) 4 MG disintegrating tablet Take 1 tablet (4 mg total) by mouth every 8 (eight) hours as needed for nausea or vomiting. Patient not taking: Reported on 03/11/2020  11/18/19   Charlesetta Shanks, MD  ondansetron (ZOFRAN) 4 MG tablet Take 1 tablet (4 mg total) by mouth every 8 (eight) hours as needed for nausea or vomiting. Patient not taking: Reported on 09/14/2019 08/27/19   Rodell Perna A, PA-C  polyethylene glycol (MIRALAX / GLYCOLAX) packet Take 17 g by mouth daily. Patient not taking: Reported on 08/27/2019 08/18/17   Stacey Drain [place], PA-C      Allergies    Amoxicillin    Review of Systems   Review of Systems   Constitutional:  Negative for fever.  Respiratory:  Negative for shortness of breath.   Cardiovascular:  Negative for chest pain.  Gastrointestinal:  Positive for abdominal pain and anorexia. Negative for nausea and vomiting.  Genitourinary:  Negative for flank pain.  Neurological:  Negative for dizziness and headaches.  All other systems reviewed and are negative.  Physical Exam Updated Vital Signs BP (!) 151/87 (BP Location: Left Arm)    Pulse 84    Temp 97.7 F (36.5 C) (Oral)    Resp 15    SpO2 100%  Physical Exam Vitals and nursing note reviewed.  Constitutional:      General: She is not in acute distress.    Appearance: She is well-developed. She is not ill-appearing.  HENT:     Head: Normocephalic and atraumatic.     Mouth/Throat:     Pharynx: No oropharyngeal exudate.  Eyes:     Pupils: Pupils are equal, round, and reactive to light.  Cardiovascular:     Rate and Rhythm: Regular rhythm.     Heart sounds: Normal heart sounds.  Pulmonary:     Effort: Pulmonary effort is normal. No respiratory distress.     Breath sounds: Normal breath sounds.  Abdominal:     General: Bowel sounds are normal. There is no distension.     Palpations: Abdomen is soft.     Tenderness: There is abdominal tenderness in the epigastric area. There is guarding. There is no right CVA tenderness or left CVA tenderness.  Musculoskeletal:        General: No tenderness or deformity.     Cervical back: Normal range of motion.     Right lower leg: No edema.     Left lower leg: No edema.  Skin:    General: Skin is warm and dry.  Neurological:     Mental Status: She is alert and oriented to person, place, and time.    ED Results / Procedures / Treatments   Labs (all labs ordered are listed, but only abnormal results are displayed) Labs Reviewed  CBC WITH DIFFERENTIAL/PLATELET - Abnormal; Notable for the following components:      Result Value   WBC 3.7 (*)    All other components within normal  limits  COMPREHENSIVE METABOLIC PANEL - Abnormal; Notable for the following components:   BUN 7 (*)    All other components within normal limits  URINALYSIS, ROUTINE W REFLEX MICROSCOPIC - Abnormal; Notable for the following components:   APPearance HAZY (*)    Leukocytes,Ua LARGE (*)    Bacteria, UA RARE (*)    Non Squamous Epithelial 0-5 (*)    All other components within normal limits  URINE CULTURE  LIPASE, BLOOD  TROPONIN I (HIGH SENSITIVITY)    EKG EKG Interpretation  Date/Time:  Saturday June 08 2021 14:29:33 EST Ventricular Rate:  83 PR Interval:  140 QRS Duration: 93 QT Interval:  398 QTC Calculation: 468 R Axis:   -41  Text Interpretation: Sinus rhythm Left axis deviation No previous ECGs available Confirmed by Gareth Morgan [id number]) on 06/08/2021 3:05:58 PM  Radiology No results found.  Procedures Procedures   Medications Ordered in ED Medications  alum & mag hydroxide-simeth (MAALOX/MYLANTA) 200-200-20 MG/5ML suspension 30 mL (30 mLs Oral Given 06/08/21 1408)    And  lidocaine (XYLOCAINE) 2 % viscous mouth solution 15 mL (15 mLs Oral Given 06/08/21 1408)    ED Course/ Medical Decision Making/ A&P Clinical Course as of 06/08/21 1509  Sat Jun 08, 2021  1434 Leukocytes,Ua(!): LARGE [JS]  1506 WBC, UA: 21-50 [JS]    Clinical Course User Index [JS] Janeece Fitting, PA-C                           Medical Decision Making Amount and/or Complexity of Data Reviewed Labs: ordered. Decision-making details documented in ED Course. Radiology: ordered.  Risk OTC drugs. Prescription drug management.  This patient presents to the ED for concern of abdominal pain, this involves a number of treatment options, and is a complaint that carries with it a high risk of complications and morbidity.  The differential diagnosis includes cholecystitis, reflux, ACS, versus enteritis.   Co morbidities: Discussed in HPI   Brief History:  Patient with a past medical  history of anxiety presents to the ED with a chief complaint of epigastric pain that is been ongoing for the past 2 days, no MAT.  Hemodynamically stable arrived in the ED. reports some anorexia, has not had much of an appetite with some nausea present.  EMR reviewed including pt PMHx, past surgical history and past visits to ER.   See HPI for more details   Lab Tests:  I ordered and independently interpreted labs.  The pertinent results include:    I personally reviewed all laboratory work and imaging. Metabolic panel without any acute abnormality specifically kidney function within normal limits and no significant electrolyte abnormalities. CBC without leukocytosis or significant anemia. EKG was added onto workup.    Imaging Studies:  CT Abdomen pending   Medicines ordered:  I ordered medication including GI cocktail for likely reflux.  Reevaluation of the patient after these medicines showed that the patient stayed the same I have reviewed the patients home medicines and have made adjustments as needed  Reevaluation:  After the interventions noted above I re-evaluated patient and found that they have :improved   Social Determinants of Health:  The patient's social determinants of health were a factor in the care of this patient  Patient care signed out to incoming provider.    Portions of this note were generated with Lobbyist. Dictation errors may occur despite best attempts at proofreading.  Final Clinical Impression(s) / ED Diagnoses Final diagnoses:  Epigastric pain    Rx / DC Orders ED Discharge Orders     None         Janeece Fitting, PA-C 06/08/21 [place], [place], DO 06/08/21 1555

## 2021-06-08 NOTE — Discharge Instructions (Addendum)
Your CT scan showed diverticulitis.  Information about this is attached to the discharge papers.  2.  I have also attached information about GERD foods to eat and avoid when you have gas and reflux  The medications I sent to the pharmacy are as follows: Ondansetron, you only need to take this as needed for nausea Omeprazole, you should take this every morning on an empty stomach.  Do not eat for 30 minutes taking this medication Maalox is the solution that will also help for any indigestion.  You only need to take this when you need it, no more than 4 times a day I refilled your MiraLAX as requested   Follow-up with your primary care provider to discuss your anxiety.  You may also speak to him about your visit today.  I have also attached a GI provider for you to have in the event that you continue to have stomach problems.

## 2021-06-09 LAB — URINE CULTURE
Culture: NO GROWTH
Special Requests: NORMAL

## 2021-07-20 ENCOUNTER — Emergency Department (HOSPITAL_COMMUNITY)
Admission: EM | Admit: 2021-07-20 | Discharge: 2021-07-20 | Disposition: A | Discharge status: Home / Self Care | Payer: Medicare Other | Attending: Emergency Medicine | Admitting: Emergency Medicine

## 2021-07-20 ENCOUNTER — Other Ambulatory Visit: Payer: Self-pay

## 2021-07-20 ENCOUNTER — Encounter (HOSPITAL_COMMUNITY): Payer: Self-pay

## 2021-07-20 DIAGNOSIS — R112 Nausea with vomiting, unspecified: Secondary | ICD-10-CM | POA: Diagnosis not present

## 2021-07-20 DIAGNOSIS — E876 Hypokalemia: Secondary | ICD-10-CM | POA: Insufficient documentation

## 2021-07-20 DIAGNOSIS — R110 Nausea: Secondary | ICD-10-CM

## 2021-07-20 LAB — CBC WITH DIFFERENTIAL/PLATELET
Abs Immature Granulocytes: 0.02 10*3/uL (ref 0.00–0.07)
Basophils Absolute: 0 10*3/uL (ref 0.0–0.1)
Basophils Relative: 0 %
Eosinophils Absolute: 0 10*3/uL (ref 0.0–0.5)
Eosinophils Relative: 0 %
HCT: 38.8 % (ref 36.0–46.0)
Hemoglobin: 12.7 g/dL (ref 12.0–15.0)
Immature Granulocytes: 0 %
Lymphocytes Relative: 19 %
Lymphs Abs: 1.2 10*3/uL (ref 0.7–4.0)
MCH: 28.1 pg (ref 26.0–34.0)
MCHC: 32.7 g/dL (ref 30.0–36.0)
MCV: 85.8 fL (ref 80.0–100.0)
Monocytes Absolute: 0.5 10*3/uL (ref 0.1–1.0)
Monocytes Relative: 8 %
Neutro Abs: 4.7 10*3/uL (ref 1.7–7.7)
Neutrophils Relative %: 73 %
Platelets: 231 10*3/uL (ref 150–400)
RBC: 4.52 MIL/uL (ref 3.87–5.11)
RDW: 13.7 % (ref 11.5–15.5)
WBC: 6.5 10*3/uL (ref 4.0–10.5)
nRBC: 0 % (ref 0.0–0.2)

## 2021-07-20 LAB — COMPREHENSIVE METABOLIC PANEL
ALT: 14 U/L (ref 0–44)
AST: 18 U/L (ref 15–41)
Albumin: 4.3 g/dL (ref 3.5–5.0)
Alkaline Phosphatase: 86 U/L (ref 38–126)
Anion gap: 6 (ref 5–15)
BUN: 6 mg/dL — ABNORMAL LOW (ref 8–23)
CO2: 28 mmol/L (ref 22–32)
Calcium: 9.4 mg/dL (ref 8.9–10.3)
Chloride: 102 mmol/L (ref 98–111)
Creatinine, Ser: 0.8 mg/dL (ref 0.44–1.00)
GFR, Estimated: >60 mL/min (ref >60)
Glucose, Bld: 115 mg/dL — ABNORMAL HIGH (ref 70–99)
Potassium: 3.2 mmol/L — ABNORMAL LOW (ref 3.5–5.1)
Sodium: 136 mmol/L (ref 135–145)
Total Bilirubin: 0.6 mg/dL (ref 0.3–1.2)
Total Protein: 7.1 g/dL (ref 6.5–8.1)

## 2021-07-20 MED ORDER — ONDANSETRON 4 MG PO TBDP
4.0000 mg | ORAL_TABLET | Freq: Once | ORAL | Status: AC
  Administered 2021-07-20: 4 mg via ORAL
  Filled 2021-07-20: qty 1

## 2021-07-20 NOTE — ED Triage Notes (Addendum)
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 Last night patient started vomiting. This is her first time doing a detox. 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 Tramadol has been around a year. She is feeling very scared and nervous. 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 She took 1/2 a pill last night.  ?

## 2021-07-20 NOTE — ED Provider Notes (Signed)
?[facility]-EMERGENCY DEPT ?Provider Note ? ?CSN: [id number] ?Arrival date & time: 07/20/21 0423 ? ?Chief Complaint(s) ?Emesis ? ?HPI ?Darlene Curtis is a 81 y.o. 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  Patient last had doses of this medicine over a week ago.  She recently started methadone prescribed to her by her primary care provider 4 days ago.  Since starting methadone she reports nauseated.  She denies any emesis.  No fevers or chills.  She does feel anxious.  No abdominal pain.  No chest pain or shortness of breath.  No other physical complaints. ? ? ?Emesis ? ?Past Medical History ?Past Medical History:  ?Diagnosis Date  ? Anxiety   ? GERD (gastroesophageal reflux disease)   ? ?Patient Active Problem List  ? Diagnosis Date Noted  ? Partial small bowel obstruction (HCC) 11/12/2012  ? Hyperkalemia 11/12/2012  ? ?Home Medication(s) ?Prior to Admission medications   ?Medication Sig Start Date End Date Taking? Authorizing Provider  ?acetaminophen (TYLENOL) 500 MG tablet Take 500 mg by mouth every 6 (six) hours as needed for mild pain or moderate pain.    [provider]  ?alendronate (FOSAMAX) 70 MG tablet Take 70 mg by mouth every Monday.  06/27/19   [provider]  ?ALPRAZolam Prudy Feeler(XANAX) 0.5 MG tablet Take 1 tablet (0.5 mg total) by mouth at bedtime as needed for anxiety. ?Patient taking differently: Take 0.5 mg by mouth 2 (two) times daily as needed for anxiety. 09/14/16   Janne NapoleonNeese, Hope M, NP  ?amLODipine (NORVASC) 2.5 MG tablet Take 2.5 mg by mouth daily as needed (high blood pressure).  03/09/20   [provider]  ?Smith MinceBlack Elderberry 50 MG/5ML SYRP Take 15 mLs by mouth daily.    [provider]  ?docusate sodium (COLACE) 100 MG capsule Take 1 capsule (100 mg total) by mouth every 12 (twelve) hours. ?Patient not taking: Reported on 09/14/2019 08/18/17   Wurst, [place], PA-C  ?fluticasone (FLONASE) 50  MCG/ACT nasal spray Place 2 sprays into both nostrils daily. 09/19/20   Rushie Chestnutovington, Sarah M, PA-C  ?HYDROcodone-acetaminophen (NORCO/VICODIN) 5-325 MG tablet Take 1 tablet by mouth 2 (two) times daily as needed for severe pain (for arthritis pain).  03/30/18   [provider]  ?HYDROcodone-acetaminophen (NORCO/VICODIN) 5-325 MG tablet Take 1 tablet by mouth every 6 (six) hours as needed for moderate pain or severe pain. ?Patient not taking: Reported on 03/11/2020 11/18/19   Arby BarrettePfeiffer, Marcy, MD  ?Multiple Vitamins-Minerals (CENTRUM SILVER 50+WOMEN) TABS Take 1 tablet by mouth daily with breakfast.    [provider]  ?omeprazole (PRILOSEC) 20 MG capsule Take 1 capsule (20 mg total) by mouth daily. 06/08/21   Redwine, Madison A, PA-C  ?ondansetron (ZOFRAN ODT) 4 MG disintegrating tablet Take 1 tablet (4 mg total) by mouth every 8 (eight) hours as needed for nausea or vomiting. ?Patient not taking: Reported on 03/11/2020 11/18/19   Arby BarrettePfeiffer, Marcy, MD  ?ondansetron (ZOFRAN) 4 MG tablet Take 1 tablet (4 mg total) by mouth every 8 (eight) hours as needed for nausea or vomiting. 06/08/21   Redwine, Madison A, PA-C  ?polyethylene glycol (MIRALAX / GLYCOLAX) 17 g packet Take 17 g by mouth daily. 06/08/21   Redwine, Madison A, PA-C  ?                                                                                                                                  ?  Allergies ?Amoxicillin ? ?Review of Systems ?Review of Systems  ?Gastrointestinal:  Positive for vomiting.  ?As noted in HPI ? ?Physical Exam ?Vital Signs  ?I have reviewed the triage vital signs ?BP (!) 163/84   Pulse 81   Temp 98.8 ?F (37.1 ?C) (Oral)   Resp 16   Ht 5\' 2"  (1.575 m)   Wt 64 kg   SpO2 100%   BMI 25.81 kg/m?  ? ?Physical Exam ?Vitals reviewed.  ?Constitutional:   ?   General: She is not in acute distress. ?   Appearance: She is well-developed. She is not diaphoretic.  ?HENT:  ?   Head: Normocephalic and atraumatic.  ?   Nose: Nose  normal.  ?Eyes:  ?   General: No scleral icterus.    ?   Right eye: No discharge.     ?   Left eye: No discharge.  ?   Conjunctiva/sclera: Conjunctivae normal.  ?   Pupils: Pupils are equal, round, and reactive to light.  ?Cardiovascular:  ?   Rate and Rhythm: Normal rate and regular rhythm.  ?   Heart sounds: No murmur heard. ?  No friction rub. No gallop.  ?Pulmonary:  ?   Effort: Pulmonary effort is normal. No respiratory distress.  ?   Breath sounds: Normal breath sounds. No stridor. No rales.  ?Abdominal:  ?   General: There is no distension.  ?   Palpations: Abdomen is soft.  ?   Tenderness: There is no abdominal tenderness.  ?Musculoskeletal:     ?   General: No tenderness.  ?   Cervical back: Normal range of motion and neck supple.  ?Skin: ?   General: Skin is warm and dry.  ?   Findings: No erythema or rash.  ?Neurological:  ?   Mental Status: She is alert and oriented to person, place, and time.  ? ? ?ED Results and Treatments ?Labs ?(all labs ordered are listed, but only abnormal results are displayed) ?Labs Reviewed  ?COMPREHENSIVE METABOLIC PANEL - Abnormal; Notable for the following components:  ?    Result Value  ? Potassium 3.2 (*)   ? Glucose, Bld 115 (*)   ? BUN 6 (*)   ? All other components within normal limits  ?CBC WITH DIFFERENTIAL/PLATELET  ?                                                                                                                       ?EKG ? EKG Interpretation ? ?Date/Time:  Saturday July 20 2021 05:38:35 EDT ?Ventricular Rate:  82 ?PR Interval:  158 ?QRS Duration: 113 ?QT Interval:  402 ?QTC Calculation: 470 ?R Axis:   -29 ?Text Interpretation: Sinus rhythm Borderline intraventricular conduction delay Borderline T abnormalities, anterior leads Confirmed by [phone]) on 07/20/2021 5:56:54 AM ?  ? ?  ? ?Radiology ?No results found. ? ?Pertinent labs & imaging results that were available during my care of the patient were reviewed by me and  considered in my  medical decision making (see MDM for details). ? ?Medications Ordered in ED ?Medications  ?ondansetron (ZOFRAN-ODT) disintegrating tablet 4 mg (4 mg Oral Given 07/20/21 2536)  ?                                                               ?                                                                    ?Procedures ?Procedures ? ?(including critical care time) ? ?Medical Decision Making / ED Course ? ? ? Complexity of Problem: ? ?Co-morbidities/SDOH that complicate the patient evaluation/care: ?Undergoing detox on methadone ? ?Additional history obtained: ?N/A ? ?Patient's presenting problem/concern, DDX, and MDM listed below: ?Nausea ?Likely side effect from methadone.  She denies any infectious symptoms.  Abdomen is benign.  Low suspicion for serious intra-abdominal inflammatory/infectious process.  We will get screening labs to assess for any electrolyte derangements.  Will get EKG to assess QTc. ? ? ?Hospitalization Considered:  ?No ? ?Initial Intervention:  ?Zofran given after EKG performed ? ?  Complexity of Data: ?  ?Cardiac Monitoring: ?N/A ? ?Laboratory Tests ordered listed below with my independent interpretation: ?CBC without leukocytosis or anemia ?Mild hypokalemia, otherwise no significant electrolyte derangements or renal insufficiency ?  ?Imaging Studies ordered listed below with my independent interpretation: ?N/A ?  ?  ?ED Course:   ? ?Assessment, Add'l Intervention, and Reassessment: ?Nausea ?Likely side effect from methadone use.  ?Labs reassuring.  QTc reassuring. ?Nausea improved after dose of Zofran. ?Patient already has a prescription for ODT Zofran given to her in February.  Recommended she take this only as needed and follow-up closely with her PCP as scheduled for Wednesday of this upcoming week.  ? ? ?Final Clinical Impression(s) / ED Diagnoses ?Final diagnoses:  ?Nausea  ? ?The patient appears reasonably screened and/or stabilized for discharge and I doubt any other medical condition or  other [facility] requiring further screening, evaluation, or treatment in the ED at this time prior to discharge. Safe for discharge with strict return precautions. ? ?Disposition: Discharge ? ?Condition: Good ? ?I have d

## 2021-08-21 IMAGING — CT CT ABD-PELV W/ CM
2 of 5 series · 17 of 46 positions shown, 19 images · IV contrast (APPLIED)
Comparison: CT abdomen pelvis dated August 27, 2019.

CLINICAL DATA: Left lower quadrant abdominal pain.

EXAM:
CT ABDOMEN AND PELVIS WITH CONTRAST
TECHNIQUE: Multidetector CT imaging of the abdomen and pelvis was performed
using the standard protocol following bolus administration of
intravenous contrast.
CONTRAST:  100mL OMNIPAQUE IOHEXOL 300 MG/ML  SOLN

[Series 3: abd/ pelvis 5.0 i30f 2 · axial · 0.83mm/px · z∈[+917,+1312]mm · 14 of 89 slices shown, 16 images]
[im 5/89  soft-tissue]
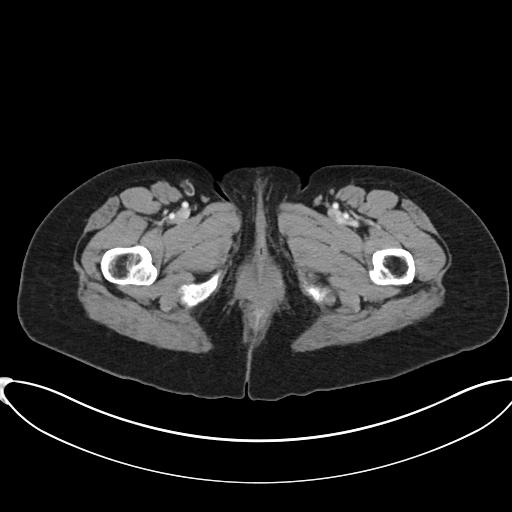
[im 5/89  bone]
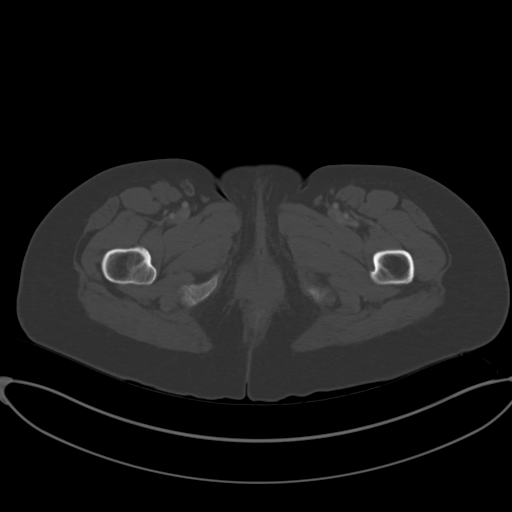
[im 14/89  soft-tissue]
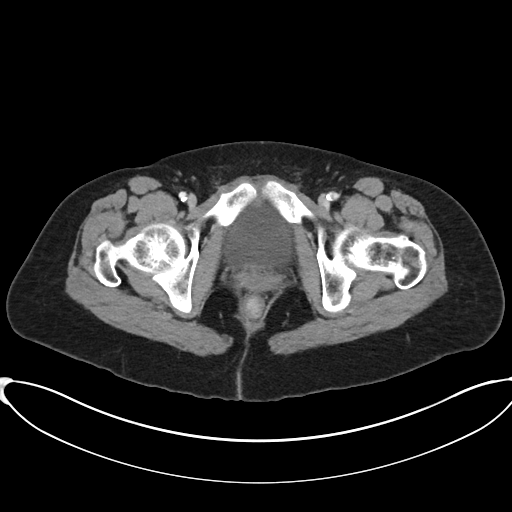
[im 18/89  soft-tissue]
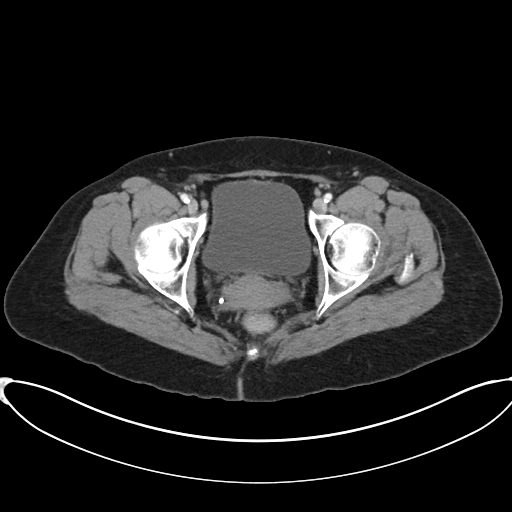
[im 23/89  soft-tissue]
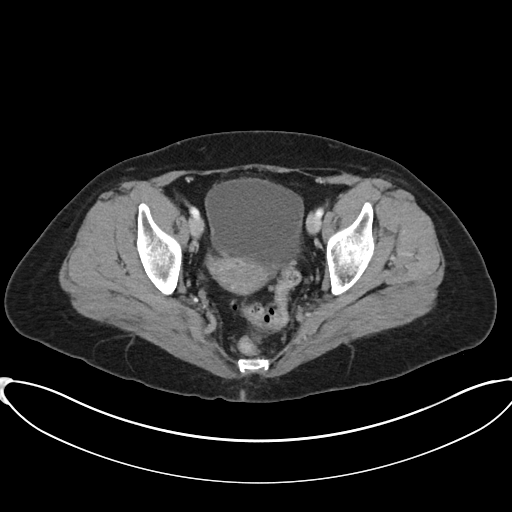
[im 31/89  soft-tissue]
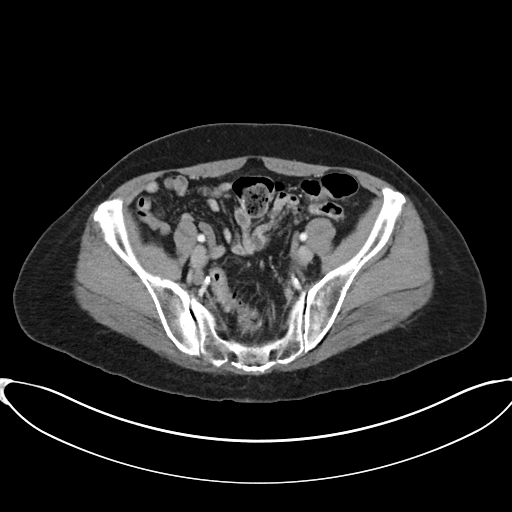
[im 36/89  soft-tissue]
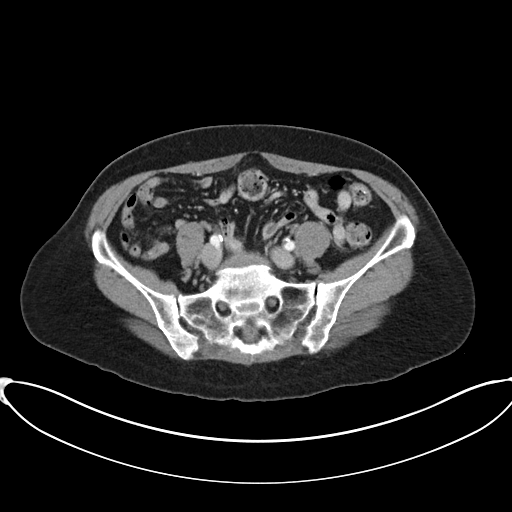
[im 40/89  soft-tissue]
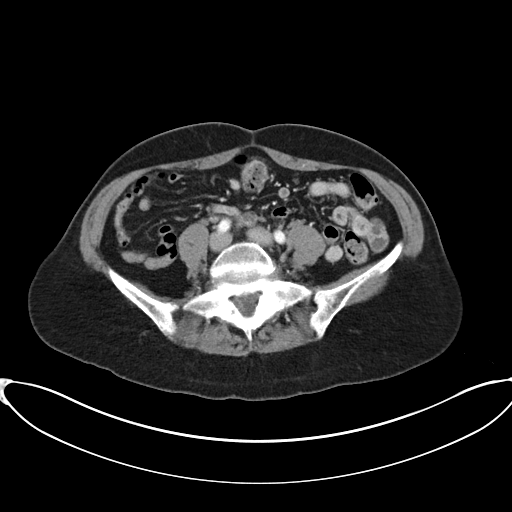
[im 49/89  soft-tissue]
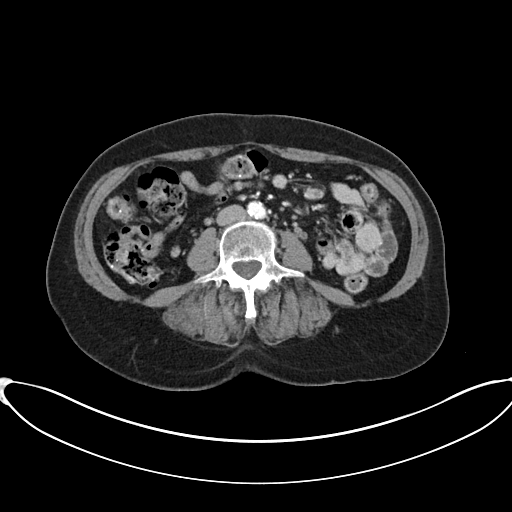
[im 53/89  soft-tissue]
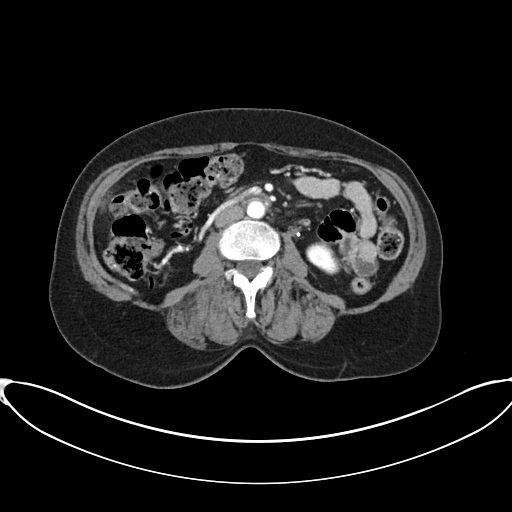
[im 53/89  bone]
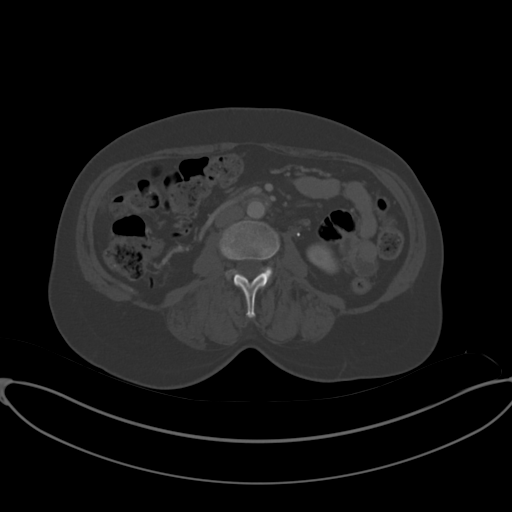
[im 58/89  soft-tissue]
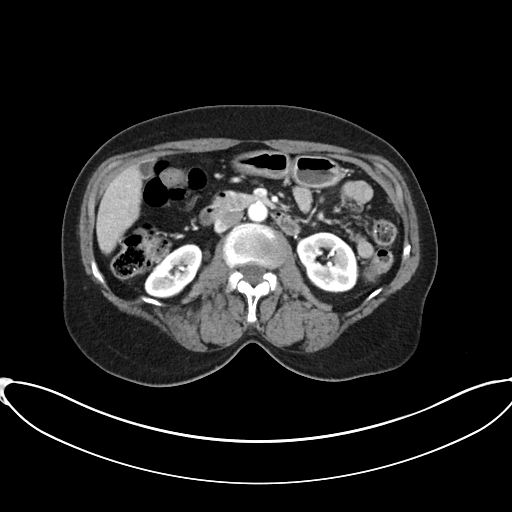
[im 67/89  soft-tissue]
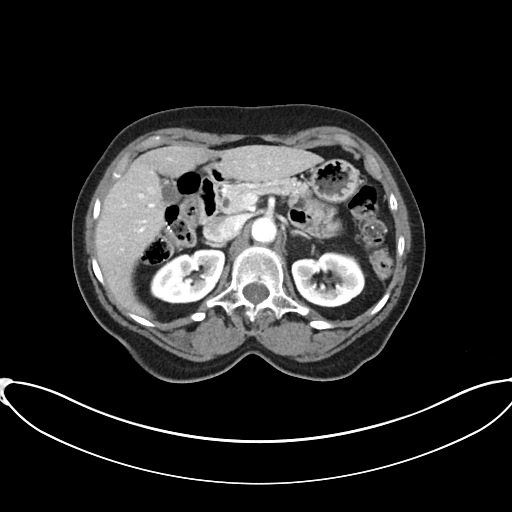
[im 71/89  soft-tissue]
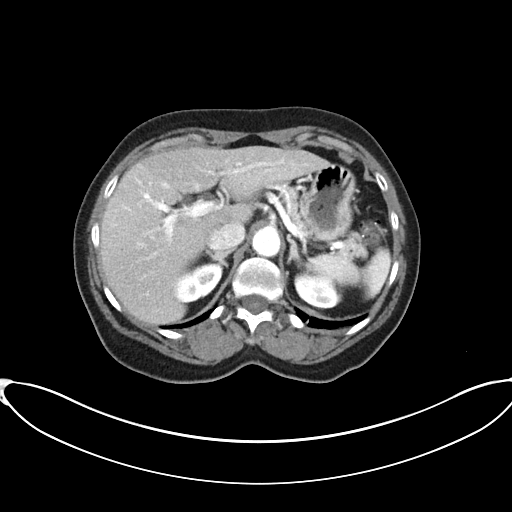
[im 75/89  soft-tissue]
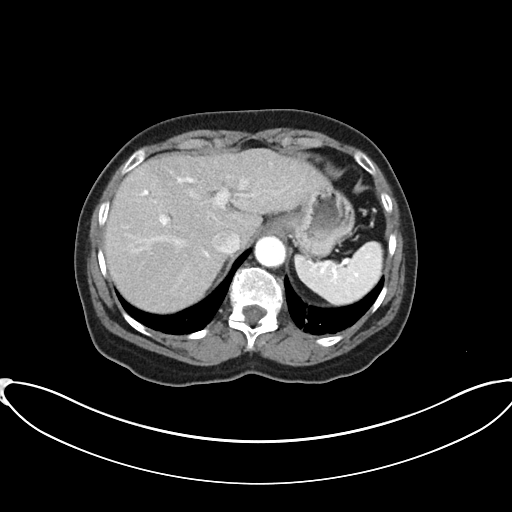
[im 84/89  soft-tissue]
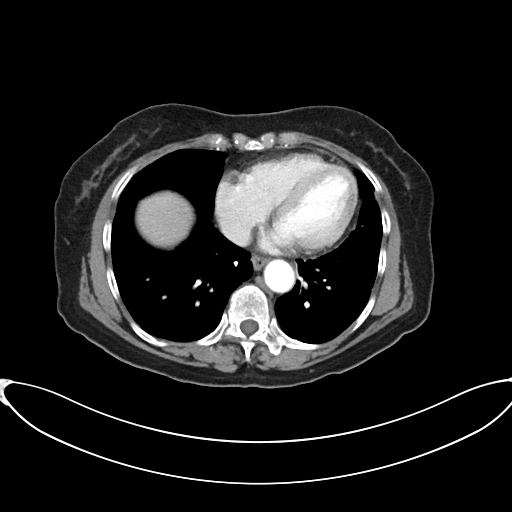

[Series 6: coronal soft tissue · coronal · 0.87mm/px · 3 of 98 slices shown]
[im 33/98  soft-tissue]
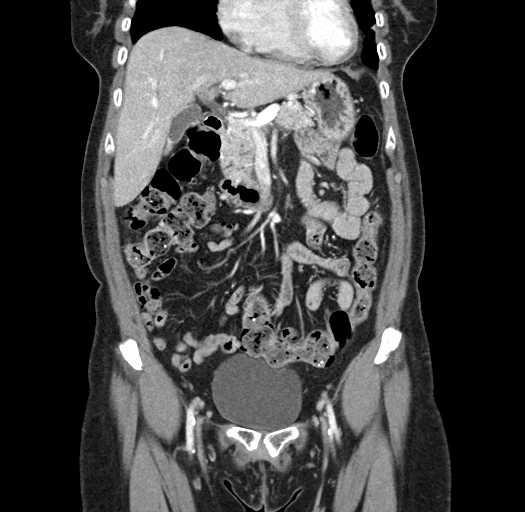
[im 44/98  soft-tissue]
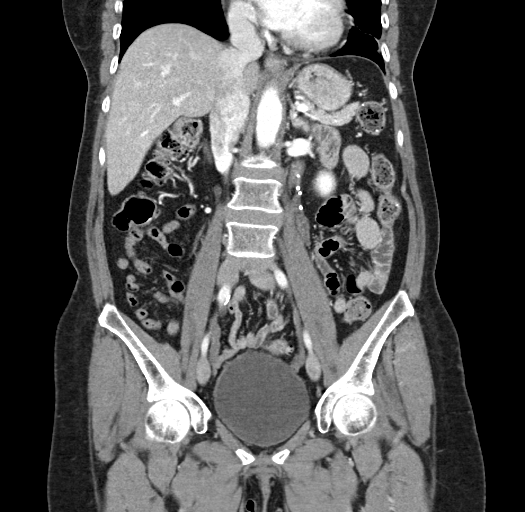
[im 54/98  soft-tissue]
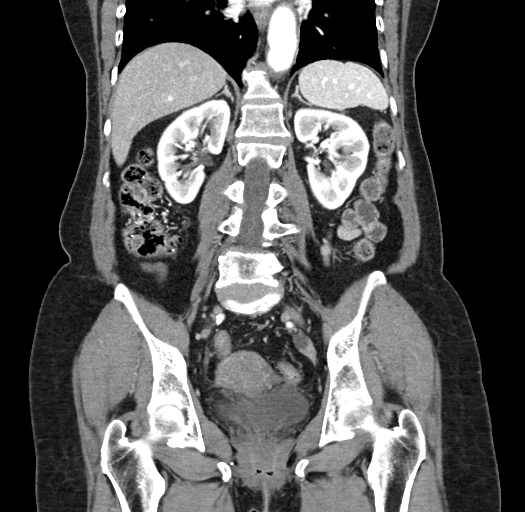

[17 of 46 positions shown; findings below may reference images not displayed]

FINDINGS: Lower chest: No acute abnormality.

Hepatobiliary: No focal liver abnormality is seen. No gallstones,
gallbladder wall thickening, or biliary dilatation.

Pancreas: Unremarkable. No pancreatic ductal dilatation or
surrounding inflammatory changes.

Spleen: Normal in size without focal abnormality.

Adrenals/Urinary Tract: Adrenal glands are unremarkable. Kidneys are
normal, without renal calculi, focal lesion, or hydronephrosis.
Bladder is unremarkable.

Stomach/Bowel: Stomach is within normal limits. Appendix appears
normal. No evidence of bowel wall thickening, distention, or
inflammatory changes. Moderate sigmoid diverticulosis with resolved
diverticulitis seen on the prior study.

Vascular/Lymphatic: Aortic atherosclerosis. No enlarged abdominal or
pelvic lymph nodes.

Reproductive: Uterus and bilateral adnexa are unremarkable.

Other: No abdominal wall hernia or abnormality. No abdominopelvic
ascites. No pneumoperitoneum.

Musculoskeletal: No acute or significant osseous findings.
IMPRESSION: 1. No acute intra-abdominal process.
2. Moderate sigmoid diverticulosis with resolved diverticulitis seen
on the prior study.
3. Aortic Atherosclerosis ([id number].M).

## 2022-09-10 ENCOUNTER — Ambulatory Visit (HOSPITAL_COMMUNITY)
Admission: EM | Admit: 2022-09-10 | Discharge: 2022-09-10 | Disposition: A | Discharge status: Home / Self Care | Payer: Medicare Other

## 2022-09-10 ENCOUNTER — Encounter (HOSPITAL_COMMUNITY): Payer: Self-pay | Admitting: Emergency Medicine

## 2022-09-10 DIAGNOSIS — F1193 Opioid use, unspecified with withdrawal: Secondary | ICD-10-CM

## 2022-09-10 DIAGNOSIS — F419 Anxiety disorder, unspecified: Secondary | ICD-10-CM

## 2022-09-10 DIAGNOSIS — G4700 Insomnia, unspecified: Secondary | ICD-10-CM

## 2022-09-10 HISTORY — DX: Sciatica, unspecified side: M54.30

## 2022-09-10 HISTORY — DX: Essential (primary) hypertension: I10

## 2022-09-10 MED ORDER — HYDROXYZINE HCL 10 MG PO TABS: 10.0000 mg | ORAL_TABLET | Freq: Four times a day (QID) | ORAL | 0 refills | Status: AC | PRN

## 2022-09-10 NOTE — ED Provider Notes (Signed)
[facility]    CSN: [id number] Arrival date & time: 09/10/22  1913      History   Chief Complaint Chief Complaint  Patient presents with   Anxiety   Insomnia    HPI Darlene Curtis is a 82 y.o. female.   Patient presents to clinic for anxiety, insomnia and palpitations.  She also has a headache that started today.  She feels depressed, down and afraid.  She has been taking tramadol every 8 hours for her back pain for the past 2 months.  She is tried to taper herself off this over the past few days, last took half a pill this morning.  Reports she has tried to taper herself off because she is concerned that she is getting addicted.  She denies any thoughts of self-harm, denies chest pain, shortness of breath or fevers.     The history is provided by the patient and medical records.  Anxiety Associated symptoms include headaches. Pertinent negatives include no chest pain and no shortness of breath.  Insomnia Associated symptoms include headaches. Pertinent negatives include no chest pain and no shortness of breath.    Past Medical History:  Diagnosis Date   Anxiety    GERD (gastroesophageal reflux disease)    Hypertension    Sciatica     Patient Active Problem List   Diagnosis Date Noted   Partial small bowel obstruction (HCC) 11/12/2012   Hyperkalemia 11/12/2012    Past Surgical History:  Procedure Laterality Date   R shoulder surgery     TUBAL LIGATION     27 years ago    OB History   No obstetric history on file.      Home Medications    Prior to Admission medications   Medication Sig Start Date End Date Taking? Authorizing Provider  hydrOXYzine (ATARAX) 10 MG tablet Take 1 tablet (10 mg total) by mouth every 6 (six) hours as needed. 09/10/22  Yes Rinaldo Ratel, [place] N, FNP  sertraline (ZOLOFT) 25 MG tablet Take 25 mg by mouth daily. 07/01/22  Yes [provider]  traMADol (ULTRAM) 50 MG tablet Take 50 mg by mouth 2 (two) times  daily. 10/17/21  Yes [provider]  acetaminophen (TYLENOL) 500 MG tablet Take 500 mg by mouth every 6 (six) hours as needed for mild pain or moderate pain.    [provider]  alendronate (FOSAMAX) 70 MG tablet Take 70 mg by mouth every Monday.  06/27/19   [provider]  ALPRAZolam Prudy Feeler) 0.5 MG tablet Take 1 tablet (0.5 mg total) by mouth at bedtime as needed for anxiety. Patient taking differently: Take 0.5 mg by mouth 2 (two) times daily as needed for anxiety. 09/14/16   Janne Napoleon, NP  amLODipine (NORVASC) 2.5 MG tablet Take 2.5 mg by mouth daily as needed (high blood pressure).  03/09/20   [provider]  Black Elderberry 50 MG/5ML SYRP Take 15 mLs by mouth daily.    [provider]  docusate sodium (COLACE) 100 MG capsule Take 1 capsule (100 mg total) by mouth every 12 (twelve) hours. Patient not taking: Reported on 09/14/2019 08/18/17   Alvino Chapel [place], PA-C  HYDROcodone-acetaminophen (NORCO/VICODIN) 5-325 MG tablet Take 1 tablet by mouth 2 (two) times daily as needed for severe pain (for arthritis pain).  03/30/18   [provider]  Multiple Vitamins-Minerals (CENTRUM SILVER 50+WOMEN) TABS Take 1 tablet by mouth daily with breakfast.    [provider]  ondansetron (ZOFRAN ODT)  4 MG disintegrating tablet Take 1 tablet (4 mg total) by mouth every 8 (eight) hours as needed for nausea or vomiting. Patient not taking: Reported on 03/11/2020 11/18/19   Arby Barrette, MD  polyethylene glycol (MIRALAX / GLYCOLAX) 17 g packet Take 17 g by mouth daily. 06/08/21   Redwine, Madison A, PA-C    Family History Family History  Problem Relation Age of Onset   Depression Son    Alcohol abuse Son     Social History Social History   Tobacco Use   Smoking status: Former   Smokeless tobacco: Never  Building services engineer Use: Never used  Substance Use Topics   Alcohol use: No   Drug use: No     Allergies   Amoxicillin   Review of  Systems Review of Systems  Respiratory:  Negative for cough and shortness of breath.   Cardiovascular:  Negative for chest pain.  Neurological:  Positive for headaches.  Psychiatric/Behavioral:  The patient has insomnia.      Physical Exam Triage Vital Signs ED Triage Vitals  Enc Vitals Group     BP 09/10/22 1927 (!) 146/90     Pulse Rate 09/10/22 1927 95     Resp 09/10/22 1927 18     Temp 09/10/22 1927 98.7 F (37.1 C)     Temp Source 09/10/22 1927 Oral     SpO2 09/10/22 1927 98 %     Weight --      Height --      Head Circumference --      Peak Flow --      Pain Score 09/10/22 1924 8     Pain Loc --      Pain Edu? --      Excl. in GC? --    No data found.  Updated Vital Signs BP (!) 146/90 (BP Location: Left Arm)   Pulse 95   Temp 98.7 F (37.1 C) (Oral)   Resp 18   SpO2 98%   Visual Acuity Right Eye Distance:   Left Eye Distance:   Bilateral Distance:    Right Eye Near:   Left Eye Near:    Bilateral Near:     Physical Exam Vitals and nursing note reviewed.  Constitutional:      Appearance: Normal appearance.  HENT:     Head: Normocephalic and atraumatic.     Right Ear: External ear normal.     Left Ear: External ear normal.     Nose: Nose normal.     Mouth/Throat:     Mouth: Mucous membranes are moist.  Eyes:     Conjunctiva/sclera: Conjunctivae normal.     Pupils: Pupils are equal, round, and reactive to light.  Cardiovascular:     Rate and Rhythm: Normal rate and regular rhythm.     Heart sounds: Normal heart sounds. No murmur heard. Pulmonary:     Effort: Pulmonary effort is normal. No respiratory distress.     Breath sounds: Normal breath sounds.  Skin:    General: Skin is warm and dry.  Neurological:     General: No focal deficit present.     Mental Status: She is alert and oriented to person, place, and time.  Psychiatric:        Mood and Affect: Mood normal.        Behavior: Behavior normal.      UC Treatments / Results   Labs (all labs ordered are listed, but only abnormal results are  displayed) Labs Reviewed - No data to display  EKG   Radiology No results found.  Procedures Procedures (including critical care time)  Medications Ordered in UC Medications - No data to display  Initial Impression / Assessment and Plan / UC Course  I have reviewed the triage vital signs and the nursing notes.  Pertinent labs & imaging results that were available during my care of the patient were reviewed by me and considered in my medical decision making (see chart for details).  Vitals and triage reviewed, patient is hemodynamically stable.  Suspect anxiety, insomnia and headache are due to tramadol withdrawal.  PERRLA, lungs vesicular, patient is alert and oriented. Will trial a low-dose hydroxyzine for symptomatic management and encouraged strict primary care follow-up.  Given resources for behavioral health urgent care, and strict return precautions.  Patient verbalized understanding, no questions at this time.     Final Clinical Impressions(s) / UC Diagnoses   Final diagnoses:  Anxiety  Opioid use with withdrawal (HCC)  Insomnia, unspecified type     Discharge Instructions      I believe your symptoms are due to your decreased use of your tramadol.  Opioid withdrawal can cause anxiety, irritability, restlessness, abdominal cramping, diarrhea, nausea, vomiting, insomnia, muscle aches, joint pain, headaches and muscle spasms.  Your most pressing symptoms seem to be anxiety and insomnia, you can trial the hydroxyzine as needed for withdrawal symptoms.  Please do not drink or drive on this medication as it may make you drowsy.  I have started you on a small dose due to your age.  It is important that you follow-up with your primary care provider for further assessment of your condition.  Please seek immediate care if you develop thoughts of self-harm, worsening anxiety, or any new concerning  symptoms.      ED Prescriptions     Medication Sig Dispense Auth. Provider   hydrOXYzine (ATARAX) 10 MG tablet Take 1 tablet (10 mg total) by mouth every 6 (six) hours as needed. 30 tablet Cay Kath, [place] N, [place]      PDMP not reviewed this encounter.   Dorman Calderwood, [place] N, [place] 09/10/22 [phone]

## 2022-09-10 NOTE — ED Triage Notes (Signed)
Pt been on tramadol for sciatic nerve pain for 2+months. Tried tapering herself off of the Tramadol and got upset about it last night and unable to sleep. Reports could tell her heart was racing and BP was elevated last night.   Pt also c/o headache that started today. Took Tylenol but didn't help. Reports having some depression denies SI.

## 2022-09-10 NOTE — Discharge Instructions (Signed)
I believe your symptoms are due to your decreased use of your tramadol.  Opioid withdrawal can cause anxiety, irritability, restlessness, abdominal cramping, diarrhea, nausea, vomiting, insomnia, muscle aches, joint pain, headaches and muscle spasms.  Your most pressing symptoms seem to be anxiety and insomnia, you can trial the hydroxyzine as needed for withdrawal symptoms.  Please do not drink or drive on this medication as it may make you drowsy.  I have started you on a small dose due to your age.  It is important that you follow-up with your primary care provider for further assessment of your condition.  Please seek immediate care if you develop thoughts of self-harm, worsening anxiety, or any new concerning symptoms.

## 2022-10-24 ENCOUNTER — Ambulatory Visit (HOSPITAL_COMMUNITY)
Admission: EM | Admit: 2022-10-24 | Discharge: 2022-10-24 | Disposition: A | Discharge status: Home / Self Care | Payer: Medicare Other | Attending: Internal Medicine | Admitting: Internal Medicine

## 2022-10-24 ENCOUNTER — Encounter (HOSPITAL_COMMUNITY): Payer: Self-pay

## 2022-10-24 DIAGNOSIS — R21 Rash and other nonspecific skin eruption: Secondary | ICD-10-CM | POA: Diagnosis not present

## 2022-10-24 MED ORDER — TRIAMCINOLONE ACETONIDE 0.1 % EX CREA: 1.0000 | TOPICAL_CREAM | Freq: Two times a day (BID) | CUTANEOUS | 0 refills | Status: DC

## 2022-10-24 NOTE — Discharge Instructions (Addendum)
Avoid wearing your garden shoes  and follow up with your primary care doctor in 7-10 days so if it is not gone, you may need to see a dermatologist

## 2022-10-24 NOTE — ED Triage Notes (Signed)
Pt states rash to her left ankle for the past 3 weeks.

## 2022-10-24 NOTE — ED Provider Notes (Signed)
[facility]    CSN: [id number] Arrival date & time: 10/24/22  1449      History   Chief Complaint Chief Complaint  Patient presents with   Rash    HPI Darlene Curtis is a 82 y.o. female who presents with L ankle rash x 3 weeks. It started on the back on her L  heel  has spread worse on her L foot than R. At times she gets itching where the thicker red border of the rash is. She states she noticed her garden shoes cause an itch on the anterior ankle when she wears them. She has had them since early Summer. They are cloth shoes not rubber. She denies pain, swelling, tingling or coldness of toes or foot. She has chronic L sciatica and received a spinal steroid shot 06/19 and has L radicular pain from that. She has soaked her feet in Epson salts, but has not applied anything on the skin.   Past Medical History:  Diagnosis Date   Anxiety    GERD (gastroesophageal reflux disease)    Hypertension    Sciatica     Patient Active Problem List   Diagnosis Date Noted   Partial small bowel obstruction (HCC) 11/12/2012   Hyperkalemia 11/12/2012    Past Surgical History:  Procedure Laterality Date   R shoulder surgery     TUBAL LIGATION     27 years ago    OB History   No obstetric history on file.      Home Medications    Prior to Admission medications   Medication Sig Start Date End Date Taking? Authorizing Provider  triamcinolone cream (KENALOG) 0.1 % Apply 1 Application topically 2 (two) times daily. For one week 10/24/22  Yes Rodriguez-Southworth, Nettie Elm, PA-C  acetaminophen (TYLENOL) 500 MG tablet Take 500 mg by mouth every 6 (six) hours as needed for mild pain or moderate pain.    [provider]  alendronate (FOSAMAX) 70 MG tablet Take 70 mg by mouth every Monday.  06/27/19   [provider]  ALPRAZolam Prudy Feeler) 0.5 MG tablet Take 1 tablet (0.5 mg total) by mouth at bedtime as needed for anxiety. Patient taking differently: Take 0.5 mg by  mouth 2 (two) times daily as needed for anxiety. 09/14/16   Janne Napoleon, NP  amLODipine (NORVASC) 2.5 MG tablet Take 2.5 mg by mouth daily as needed (high blood pressure).  03/09/20   [provider]  Black Elderberry 50 MG/5ML SYRP Take 15 mLs by mouth daily.    [provider]  HYDROcodone-acetaminophen (NORCO/VICODIN) 5-325 MG tablet Take 1 tablet by mouth 2 (two) times daily as needed for severe pain (for arthritis pain).  03/30/18   [provider]  hydrOXYzine (ATARAX) 10 MG tablet Take 1 tablet (10 mg total) by mouth every 6 (six) hours as needed. 09/10/22   Garrison, [place] N, FNP  Multiple Vitamins-Minerals (CENTRUM SILVER 50+WOMEN) TABS Take 1 tablet by mouth daily with breakfast.    [provider]  polyethylene glycol (MIRALAX / GLYCOLAX) 17 g packet Take 17 g by mouth daily. 06/08/21   Redwine, Madison A, PA-C  sertraline (ZOLOFT) 25 MG tablet Take 25 mg by mouth daily. 07/01/22   [provider]  traMADol (ULTRAM) 50 MG tablet Take 50 mg by mouth 2 (two) times daily. 10/17/21   [provider]    Family History Family History  Problem Relation Age of Onset   Depression Son    Alcohol  abuse Son     Social History Social History   Tobacco Use   Smoking status: Former   Smokeless tobacco: Never  Advertising account planner   Vaping status: Never Used  Substance Use Topics   Alcohol use: No   Drug use: No     Allergies   Amoxicillin   Review of Systems Review of Systems  As noted in HPI  Physical Exam Triage Vital Signs ED Triage Vitals  Encounter Vitals Group     BP 10/24/22 1531 (!) 146/74     Systolic BP Percentile --      Diastolic BP Percentile --      Pulse Rate 10/24/22 1531 88     Resp 10/24/22 1531 16     Temp 10/24/22 1531 99 F (37.2 C)     Temp Source 10/24/22 1531 Oral     SpO2 10/24/22 1531 98 %     Weight --      Height --      Head Circumference --      Peak Flow --      Pain Score 10/24/22 1532 0      Pain Loc --      Pain Education --      Exclude from Growth Chart --    No data found.  Updated Vital Signs BP (!) 146/74 (BP Location: Left Arm)   Pulse 88   Temp 99 F (37.2 C) (Oral)   Resp 16   SpO2 98%   Visual Acuity Right Eye Distance:   Left Eye Distance:   Bilateral Distance:    Right Eye Near:   Left Eye Near:    Bilateral Near:     Physical Exam Vitals and nursing note reviewed.  Constitutional:      General: She is not in acute distress.    Appearance: She is normal weight. She is not toxic-appearing.  HENT:     Right Ear: External ear normal.     Left Ear: External ear normal.  Eyes:     General: No scleral icterus.    Conjunctiva/sclera: Conjunctivae normal.  Cardiovascular:     Pulses: Normal pulses.  Pulmonary:     Effort: Pulmonary effort is normal.  Musculoskeletal:        General: No swelling, tenderness or deformity.     Cervical back: Neck supple.     Right lower leg: No edema.     Left lower leg: No edema.  Skin:    General: Skin is warm and dry.     Capillary Refill: Capillary refill takes less than 2 seconds.     Findings: Rash present.     Comments: L FOOT- with erythema multiforme type border rash on  anterior ankle and back of foot over achillis tendon region and goes on the lateral border, but is darker pigmented on this area compared the the erythematous border on the prior  mentioned areas. It is not hot or painful.   Neurological:     Mental Status: She is alert and oriented to person, place, and time.     Gait: Gait normal.  Psychiatric:        Mood and Affect: Mood normal.        Behavior: Behavior normal.        Thought Content: Thought content normal.        Judgment: Judgment normal.      UC Treatments / Results  Labs (all labs ordered are listed, but only abnormal results  are displayed) Labs Reviewed - No data to display  EKG   Radiology No results found.  Procedures Procedures (including critical care  time)  Medications Ordered in UC Medications - No data to display  Initial Impression / Assessment and Plan / UC Course  I have reviewed the triage vital signs and the nursing notes.  Possible local allergy to her garden shoes  I placed her on Triamcinolone cream as noted and needs to FU with PCP in one week in case she needs derm referral.     Final Clinical Impressions(s) / UC Diagnoses   Final diagnoses:  Rash     Discharge Instructions      Avoid wearing your garden shoes  and follow up with your primary care doctor in 7-10 days so if it is not gone, you may need to see a dermatologist      ED Prescriptions     Medication Sig Dispense Auth. Provider   triamcinolone cream (KENALOG) 0.1 % Apply 1 Application topically 2 (two) times daily. For one week 80 g Rodriguez-Southworth, Nettie Elm, PA-C      PDMP not reviewed this encounter.   Garey Ham, PA-C 10/24/22 1601

## 2022-12-12 ENCOUNTER — Ambulatory Visit (HOSPITAL_COMMUNITY)
Admission: EM | Admit: 2022-12-12 | Discharge: 2022-12-12 | Disposition: A | Discharge status: Home / Self Care | Payer: Medicare Other | Source: Home / Self Care

## 2022-12-12 ENCOUNTER — Encounter (HOSPITAL_COMMUNITY): Payer: Self-pay

## 2022-12-12 DIAGNOSIS — M5432 Sciatica, left side: Secondary | ICD-10-CM | POA: Diagnosis not present

## 2022-12-12 MED ORDER — TRAMADOL HCL 50 MG PO TABS: 50.0000 mg | ORAL_TABLET | Freq: Four times a day (QID) | ORAL | 0 refills | Status: AC | PRN

## 2022-12-12 NOTE — ED Triage Notes (Signed)
Left leg pain and numbness 3 days. Ongoing for years but worse in the last 3 days. Got an injection in the spinal area on 8/19 that has worn off.  No recent falls or injuries.

## 2022-12-12 NOTE — ED Provider Notes (Signed)
[facility]    CSN: [id number] Arrival date & time: 12/12/22  1750      History   Chief Complaint Chief Complaint  Patient presents with   Leg Pain    HPI NEHEMIAH ZADRA is a 82 y.o. female.   Tried to contact her neuro group last Friday and middle of this week but they said it was too early. Over a week off and on with left leg pain and weakness now much worse first of this week and continued to worsen with almost not being able to put weight on it. No recent injuries or falls to explain increase in pain. Numbness tingling that starts at buttocks and goes to ankle. Never been this bad in the past but had been on pain meds. Now getting injections but last was June. Can get another around September 16th.    Leg Pain Associated symptoms: no back pain and no fever     Past Medical History:  Diagnosis Date   Anxiety    GERD (gastroesophageal reflux disease)    Hypertension    Sciatica     Patient Active Problem List   Diagnosis Date Noted   Partial small bowel obstruction (HCC) 11/12/2012   Hyperkalemia 11/12/2012    Past Surgical History:  Procedure Laterality Date   R shoulder surgery     TUBAL LIGATION     27 years ago    OB History   No obstetric history on file.      Home Medications    Prior to Admission medications   Medication Sig Start Date End Date Taking? Authorizing Provider  acetaminophen (TYLENOL) 500 MG tablet Take 500 mg by mouth every 6 (six) hours as needed for mild pain or moderate pain.   Yes [provider]  alendronate (FOSAMAX) 70 MG tablet Take 70 mg by mouth every Monday.  06/27/19  Yes [provider]  amLODipine (NORVASC) 2.5 MG tablet Take 2.5 mg by mouth daily as needed (high blood pressure).  03/09/20  Yes [provider]  hydrOXYzine (ATARAX) 10 MG tablet Take 1 tablet (10 mg total) by mouth every 6 (six) hours as needed. 09/10/22  Yes Rinaldo Ratel, [place] N, FNP  Multiple Vitamins-Minerals  (CENTRUM SILVER 50+WOMEN) TABS Take 1 tablet by mouth daily with breakfast.   Yes [provider]  traMADol (ULTRAM) 50 MG tablet Take 50 mg by mouth 2 (two) times daily. 10/17/21  Yes [provider]  ALPRAZolam Prudy Feeler) 0.5 MG tablet Take 1 tablet (0.5 mg total) by mouth at bedtime as needed for anxiety. Patient taking differently: Take 0.5 mg by mouth 2 (two) times daily as needed for anxiety. 09/14/16   Janne Napoleon, NP  Black Elderberry 50 MG/5ML SYRP Take 15 mLs by mouth daily.    [provider]  HYDROcodone-acetaminophen (NORCO/VICODIN) 5-325 MG tablet Take 1 tablet by mouth 2 (two) times daily as needed for severe pain (for arthritis pain).  03/30/18   [provider]  polyethylene glycol (MIRALAX / GLYCOLAX) 17 g packet Take 17 g by mouth daily. 06/08/21   Redwine, Madison A, PA-C  sertraline (ZOLOFT) 25 MG tablet Take 25 mg by mouth daily. 07/01/22   [provider]  triamcinolone cream (KENALOG) 0.1 % Apply 1 Application topically 2 (two) times daily. For one week 10/24/22   Rodriguez-Southworth, Nettie Elm, PA-C    Family History Family History  Problem Relation Age of Onset   Depression Son    Alcohol abuse Son  Social History Social History   Tobacco Use   Smoking status: Former   Smokeless tobacco: Never  Advertising account planner   Vaping status: Never Used  Substance Use Topics   Alcohol use: No   Drug use: No     Allergies   Amoxicillin   Review of Systems Review of Systems  Constitutional:  Negative for chills and fever.  HENT:  Negative for ear pain and sore throat.   Eyes:  Negative for pain and visual disturbance.  Respiratory:  Negative for cough and shortness of breath.   Cardiovascular:  Negative for chest pain and palpitations.  Gastrointestinal:  Negative for abdominal pain and vomiting.  Genitourinary:  Negative for dysuria and hematuria.  Musculoskeletal:  Positive for gait problem (due to left leg weakness and pain).  Negative for arthralgias and back pain.  Skin:  Negative for color change and rash.  Neurological:  Positive for weakness (worsened in the last few days) and numbness (left leg, chronic). Negative for seizures and syncope.  All other systems reviewed and are negative.    Physical Exam Triage Vital Signs ED Triage Vitals  Encounter Vitals Group     BP 12/12/22 1812 (!) 158/90     Systolic BP Percentile --      Diastolic BP Percentile --      Pulse Rate 12/12/22 1812 83     Resp 12/12/22 1812 16     Temp 12/12/22 1812 98.5 F (36.9 C)     Temp Source 12/12/22 1812 Oral     SpO2 12/12/22 1812 98 %     Weight 12/12/22 1812 135 lb (61.2 kg)     Height 12/12/22 1812 5\' 2"  (1.575 m)     Head Circumference --      Peak Flow --      Pain Score 12/12/22 1810 9     Pain Loc --      Pain Education --      Exclude from Growth Chart --    No data found.  Updated Vital Signs BP (!) 158/90 (BP Location: Right Arm)   Pulse 83   Temp 98.5 F (36.9 C) (Oral)   Resp 16   Ht 5\' 2"  (1.575 m)   Wt 135 lb (61.2 kg)   SpO2 98%   BMI 24.69 kg/m   Visual Acuity Right Eye Distance:   Left Eye Distance:   Bilateral Distance:    Right Eye Near:   Left Eye Near:    Bilateral Near:     Physical Exam Nursing note reviewed.      UC Treatments / Results  Labs (all labs ordered are listed, but only abnormal results are displayed) Labs Reviewed - No data to display  EKG   Radiology No results found.  Procedures Procedures (including critical care time)  Medications Ordered in UC Medications - No data to display  Initial Impression / Assessment and Plan / UC Course  I have reviewed the triage vital signs and the nursing notes.  Pertinent labs & imaging results that were available during my care of the patient were reviewed by me and considered in my medical decision making (see chart for details).     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  This pain is more than her usual but she is also not been taking any type of pain medication as she  weaned herself off.  I am concerned about giving her a steroid injection here in the office given that she is scheduled in 2 weeks for an injection at her neuro office.  For now we will give her tramadol which she has tolerated in the past.  Also discussed with her and her daughter that they need to have a safety plan in place given that the patient is having pain in the left leg that causes her to be weak.  Counseled her about keeping her phone with her at all times and to not to use stairs without someone with her.  Return to urgent care if her symptoms worsen or fail to improve. Final Clinical Impressions(s) / UC Diagnoses   Final diagnoses:  None   Discharge Instructions   None    ED Prescriptions   None    PDMP not reviewed this encounter.   Landis Martins, [place] 12/12/22 1936

## 2022-12-12 NOTE — Discharge Instructions (Signed)
Tramadol called in to [facility].  Make sure to contact your spine physician on Tuesday to let them know that this has been started.  Make sure that there is a safety plan in place for the left leg weakness and discussed this at length with her daughter.  Return to urgent care if symptoms worsen.

## 2023-01-09 ENCOUNTER — Ambulatory Visit (HOSPITAL_COMMUNITY)
Admission: EM | Admit: 2023-01-09 | Discharge: 2023-01-09 | Disposition: A | Discharge status: Home / Self Care | Payer: Medicare Other

## 2023-01-09 ENCOUNTER — Encounter (HOSPITAL_COMMUNITY): Payer: Self-pay

## 2023-01-09 DIAGNOSIS — M5432 Sciatica, left side: Secondary | ICD-10-CM

## 2023-01-09 MED ORDER — TRAMADOL HCL 50 MG PO TABS
50.0000 mg | ORAL_TABLET | Freq: Four times a day (QID) | ORAL | 0 refills | Status: AC | PRN
Start: 2023-01-09 — End: 2023-01-14

## 2023-01-09 NOTE — Discharge Instructions (Addendum)
You can take the tramadol every 6 hours as needed until you follow-up with your [facility] Neurosurgery team.  They had initially mentioned potentially starting a low-dose gabapentin or Lyrica, please refer to them for follow-up with your primary care to see if this is a good option for you.  Return to clinic for any new or urgent symptoms.

## 2023-01-09 NOTE — ED Triage Notes (Signed)
Patient here today with c/o left LB pain that shoots down left leg. Had injection on 12/25/2022 for sciatica but not helping. She takes Tramadol and Tylenol for pain with some relief.

## 2023-01-09 NOTE — ED Provider Notes (Signed)
[facility]    CSN: [id number] Arrival date & time: 01/09/23  1810      History   Chief Complaint Chief Complaint  Patient presents with   Back Pain    HPI Darlene Curtis is a 82 y.o. female.   Patient presents to clinic for chronic left-sided low back pain that radiates down her left leg.  She did have a spinal injection at [facility] on 9/12 which worked for 3 or 4 days.  On the fourth day she was waking up in pain, reports she had had an injection previously and it worked better than this one. She also noticed some new swelling to her left ankle that is tender to palpation and pitting.   Per the notes from [facility] patient is not a good candidate for most other treatment options and tramadol seems to be the best option.  She does take tramadol daily, does not take more than 2/day.   Reports when she sits or goes to lays down she feels her hip catch.  Denies any recent falls or trauma.   The history is provided by the patient and medical records.    Past Medical History:  Diagnosis Date   Anxiety    GERD (gastroesophageal reflux disease)    Hypertension    Sciatica     Patient Active Problem List   Diagnosis Date Noted   Partial small bowel obstruction (HCC) 11/12/2012   Hyperkalemia 11/12/2012    Past Surgical History:  Procedure Laterality Date   R shoulder surgery     TUBAL LIGATION     27 years ago    OB History   No obstetric history on file.      Home Medications    Prior to Admission medications   Medication Sig Start Date End Date Taking? Authorizing Provider  rosuvastatin (CRESTOR) 20 MG tablet Take 20 mg by mouth daily. 10/18/22  Yes [provider]  traMADol (ULTRAM) 50 MG tablet Take 1 tablet (50 mg total) by mouth every 6 (six) hours as needed for up to 5 days. 01/09/23 01/14/23 Yes Rinaldo Ratel, [place] N, FNP  acetaminophen (TYLENOL) 500 MG tablet Take 500 mg by mouth every 6 (six) hours as needed for  mild pain or moderate pain.    [provider]  alendronate (FOSAMAX) 70 MG tablet Take 70 mg by mouth every Monday.  06/27/19   [provider]  amLODipine (NORVASC) 2.5 MG tablet Take 2.5 mg by mouth daily as needed (high blood pressure).  03/09/20   [provider]  hydrOXYzine (ATARAX) 10 MG tablet Take 1 tablet (10 mg total) by mouth every 6 (six) hours as needed. 09/10/22   Randol Zumstein, [place] N, FNP  Multiple Vitamins-Minerals (CENTRUM SILVER 50+WOMEN) TABS Take 1 tablet by mouth daily with breakfast.    [provider]  traMADol (ULTRAM) 50 MG tablet Take 1 tablet (50 mg total) by mouth every 6 (six) hours as needed. 12/12/22   White, Elizabeth A, PA-C  triamcinolone cream (KENALOG) 0.1 % Apply 1 Application topically 2 (two) times daily. For one week 10/24/22   Rodriguez-Southworth, Nettie Elm, PA-C    Family History Family History  Problem Relation Age of Onset   Depression Son    Alcohol abuse Son     Social History Social History   Tobacco Use   Smoking status: Former   Smokeless tobacco: Never  Vaping Use   Vaping status: Never Used  Substance Use Topics   Alcohol  use: No   Drug use: No     Allergies   Amoxicillin   Review of Systems Review of Systems  Cardiovascular:  Positive for leg swelling.  Musculoskeletal:  Positive for back pain.  Neurological:  Negative for weakness and numbness.     Physical Exam Triage Vital Signs ED Triage Vitals  Encounter Vitals Group     BP      Systolic BP Percentile      Diastolic BP Percentile      Pulse      Resp      Temp      Temp src      SpO2      Weight      Height      Head Circumference      Peak Flow      Pain Score      Pain Loc      Pain Education      Exclude from Growth Chart    No data found.  Updated Vital Signs BP (!) 143/80 (BP Location: Left Arm)   Pulse 85   Temp 98.4 F (36.9 C) (Oral)   Resp 16   Ht 5\' 2"  (1.575 m)   Wt 135 lb (61.2 kg)   SpO2 98%    BMI 24.69 kg/m   Visual Acuity Right Eye Distance:   Left Eye Distance:   Bilateral Distance:    Right Eye Near:   Left Eye Near:    Bilateral Near:     Physical Exam Vitals and nursing note reviewed.  Constitutional:      Appearance: Normal appearance.  HENT:     Head: Normocephalic and atraumatic.     Right Ear: External ear normal.     Left Ear: External ear normal.     Nose: Nose normal.     Mouth/Throat:     Mouth: Mucous membranes are moist.  Eyes:     Conjunctiva/sclera: Conjunctivae normal.  Cardiovascular:     Rate and Rhythm: Normal rate.  Pulmonary:     Effort: Pulmonary effort is normal. No respiratory distress.  Musculoskeletal:        General: Normal range of motion.     Left lower leg: 1+ Pitting Edema present.  Skin:    General: Skin is warm and dry.     Capillary Refill: Capillary refill takes less than 2 seconds.  Neurological:     General: No focal deficit present.     Mental Status: She is alert and oriented to person, place, and time.  Psychiatric:        Mood and Affect: Mood normal.        Behavior: Behavior normal. Behavior is cooperative.      UC Treatments / Results  Labs (all labs ordered are listed, but only abnormal results are displayed) Labs Reviewed - No data to display  EKG   Radiology No results found.  Procedures Procedures (including critical care time)  Medications Ordered in UC Medications - No data to display  Initial Impression / Assessment and Plan / UC Course  I have reviewed the triage vital signs and the nursing notes.  Pertinent labs & imaging results that were available during my care of the patient were reviewed by me and considered in my medical decision making (see chart for details).  Vitals and triage reviewed, patient is hemodynamically stable.  Back pain is stable and unchanging.  Atraumatic.  Patient is on to get standard it for steroid  injection, just received one a few weeks prior.  Not a good  candidate for NSAIDs due to age.  Will increase tramadol to every 6 hours during this acute exacerbation until she can follow-up with [facility] next week.  Does have 1+ pitting edema to left lower extremity, unclear etiology.  Calf is nontender.  Low concern for DVT at this time.  Plan of care, follow-up care return precautions given, no questions at this time.    Final Clinical Impressions(s) / UC Diagnoses   Final diagnoses:  Sciatica of left side     Discharge Instructions      You can take the tramadol every 6 hours as needed until you follow-up with your [facility] Neurosurgery team.  They had initially mentioned potentially starting a low-dose gabapentin or Lyrica, please refer to them for follow-up with your primary care to see if this is a good option for you.  Return to clinic for any new or urgent symptoms.     ED Prescriptions     Medication Sig Dispense Auth. Provider   traMADol (ULTRAM) 50 MG tablet Take 1 tablet (50 mg total) by mouth every 6 (six) hours as needed for up to 5 days. 15 tablet Manas Hickling, [place] N, [place]      I have reviewed the PDMP during this encounter.   Dayanne Yiu, [place] N, [place] 01/09/23 [phone]

## 2023-03-21 ENCOUNTER — Encounter (HOSPITAL_COMMUNITY): Payer: Self-pay | Admitting: Emergency Medicine

## 2023-03-21 ENCOUNTER — Ambulatory Visit (HOSPITAL_COMMUNITY)
Admission: EM | Admit: 2023-03-21 | Discharge: 2023-03-21 | Disposition: A | Discharge status: Home / Self Care | Payer: Medicare Other | Attending: Nurse Practitioner | Admitting: Nurse Practitioner

## 2023-03-21 DIAGNOSIS — J069 Acute upper respiratory infection, unspecified: Secondary | ICD-10-CM | POA: Diagnosis not present

## 2023-03-21 MED ORDER — BENZONATATE 100 MG PO CAPS: 100.0000 mg | ORAL_CAPSULE | Freq: Three times a day (TID) | ORAL | 0 refills | Status: DC | PRN

## 2023-03-21 NOTE — Discharge Instructions (Signed)
You have a viral upper respiratory infection.  Symptoms should improve over the next week to 10 days.  If you develop chest pain or shortness of breath, go to the emergency room.  Some things that can make you feel better are: - Increased rest - Increasing fluid with water/sugar free electrolytes - Acetaminophen and ibuprofen as needed for fever/pain - Salt water gargling, chloraseptic spray and throat lozenges - OTC guaifenesin (Mucinex) 600 mg twice daily for congestion - Saline sinus flushes or a neti pot - Humidifying the air -Tessalon Perles every 8 hours as needed for dry cough 

## 2023-03-21 NOTE — ED Triage Notes (Signed)
Pt c/o cough, congestion since Tuesday. States her grandkids have been sick recenetly with RSV  She has been taking mucinex

## 2023-03-21 NOTE — ED Provider Notes (Signed)
[facility]    CSN: [id number] Arrival date & time: 03/21/23  1243      History   Chief Complaint Chief Complaint  Patient presents with   Cough    HPI Darlene Curtis is a 82 y.o. female.   Patient presents today with 5-day history of congested cough, stuffy nose, runny nose, pressure behind her eyes, headache, and slight fatigue.  She denies fever, body aches or chills, shortness of breath or chest pain, sore throat, ear pain, abdominal pain, nausea/vomiting, and diarrhea.  No change in appetite.  Has taken Mucinex for symptoms which does seem to help.  Reports her grandchild recently tested positive for RSV and she has been around her grandchild.    Patient notes her blood pressure is elevated today, reports has not taken blood pressure medication yet today.    Past Medical History:  Diagnosis Date   Anxiety    GERD (gastroesophageal reflux disease)    Hypertension    Sciatica     Patient Active Problem List   Diagnosis Date Noted   Partial small bowel obstruction (HCC) 11/12/2012   Hyperkalemia 11/12/2012    Past Surgical History:  Procedure Laterality Date   R shoulder surgery     TUBAL LIGATION     27 years ago    OB History   No obstetric history on file.      Home Medications    Prior to Admission medications   Medication Sig Start Date End Date Taking? Authorizing Provider  benzonatate (TESSALON) 100 MG capsule Take 1 capsule (100 mg total) by mouth 3 (three) times daily as needed for cough. Do not take with alcohol or while driving or operating heavy machinery.  May cause drowsiness. 03/21/23  Yes Valentino Nose, NP  acetaminophen (TYLENOL) 500 MG tablet Take 500 mg by mouth every 6 (six) hours as needed for mild pain or moderate pain.    [provider]  alendronate (FOSAMAX) 70 MG tablet Take 70 mg by mouth every Monday.  06/27/19   [provider]  amLODipine (NORVASC) 2.5 MG tablet Take 2.5 mg by mouth daily  as needed (high blood pressure).  03/09/20   [provider]  hydrOXYzine (ATARAX) 10 MG tablet Take 1 tablet (10 mg total) by mouth every 6 (six) hours as needed. 09/10/22   Garrison, [place] N, FNP  Multiple Vitamins-Minerals (CENTRUM SILVER 50+WOMEN) TABS Take 1 tablet by mouth daily with breakfast.    [provider]  rosuvastatin (CRESTOR) 20 MG tablet Take 20 mg by mouth daily. 10/18/22   [provider]  traMADol (ULTRAM) 50 MG tablet Take 1 tablet (50 mg total) by mouth every 6 (six) hours as needed. 12/12/22   White, Elizabeth A, PA-C  triamcinolone cream (KENALOG) 0.1 % Apply 1 Application topically 2 (two) times daily. For one week 10/24/22   Rodriguez-Southworth, Nettie Elm, PA-C    Family History Family History  Problem Relation Age of Onset   Depression Son    Alcohol abuse Son     Social History Social History   Tobacco Use   Smoking status: Former   Smokeless tobacco: Never  Advertising account planner   Vaping status: Never Used  Substance Use Topics   Alcohol use: No   Drug use: No     Allergies   Amoxicillin   Review of Systems Review of Systems Per HPI  Physical Exam Triage Vital Signs ED Triage Vitals  Encounter Vitals Group     BP  03/21/23 1322 (!) 161/95     Systolic BP Percentile --      Diastolic BP Percentile --      Pulse Rate 03/21/23 1322 88     Resp 03/21/23 1322 17     Temp 03/21/23 1322 99 F (37.2 C)     Temp Source 03/21/23 1322 Oral     SpO2 03/21/23 1322 98 %     Weight --      Height --      Head Circumference --      Peak Flow --      Pain Score 03/21/23 1320 0     Pain Loc --      Pain Education --      Exclude from Growth Chart --    No data found.  Updated Vital Signs BP (!) 161/95 Comment: did not take bp medication today  Pulse 88   Temp 99 F (37.2 C) (Oral)   Resp 17   SpO2 98%   Visual Acuity Right Eye Distance:   Left Eye Distance:   Bilateral Distance:    Right Eye Near:   Left Eye Near:     Bilateral Near:     Physical Exam Vitals and nursing note reviewed.  Constitutional:      General: She is not in acute distress.    Appearance: Normal appearance. She is not ill-appearing or toxic-appearing.  HENT:     Head: Normocephalic and atraumatic.     Right Ear: Tympanic membrane, ear canal and external ear normal.     Left Ear: Tympanic membrane, ear canal and external ear normal.     Nose: Congestion present. No rhinorrhea.     Mouth/Throat:     Mouth: Mucous membranes are moist.     Pharynx: Oropharynx is clear. No oropharyngeal exudate or posterior oropharyngeal erythema.  Eyes:     General: No scleral icterus.    Extraocular Movements: Extraocular movements intact.  Cardiovascular:     Rate and Rhythm: Normal rate and regular rhythm.  Pulmonary:     Effort: Pulmonary effort is normal. No respiratory distress.     Breath sounds: Normal breath sounds. No wheezing, rhonchi or rales.  Musculoskeletal:     Cervical back: Normal range of motion and neck supple.  Lymphadenopathy:     Cervical: No cervical adenopathy.  Skin:    General: Skin is warm and dry.     Coloration: Skin is not jaundiced or pale.     Findings: No erythema or rash.  Neurological:     Mental Status: She is alert and oriented to person, place, and time.     Motor: No weakness.  Psychiatric:        Behavior: Behavior is cooperative.      UC Treatments / Results  Labs (all labs ordered are listed, but only abnormal results are displayed) Labs Reviewed - No data to display  EKG   Radiology No results found.  Procedures Procedures (including critical care time)  Medications Ordered in UC Medications - No data to display  Initial Impression / Assessment and Plan / UC Course  I have reviewed the triage vital signs and the nursing notes.  Pertinent labs & imaging results that were available during my care of the patient were reviewed by me and considered in my medical decision making  (see chart for details).   Patient is mildly hypertensive in triage, otherwise vital signs are stable and patient is well-appearing.  1. Viral URI  with cough Suspect viral etiology Vitals and exam are reassuring Viral testing deferred given length of symptoms Supportive care discussed with patient, start cough suppressant medication ER and return precautions discussed  The patient was given the opportunity to ask questions.  All questions answered to their satisfaction.  The patient is in agreement to this plan.    Final Clinical Impressions(s) / UC Diagnoses   Final diagnoses:  Viral URI with cough     Discharge Instructions      You have a viral upper respiratory infection.  Symptoms should improve over the next week to 10 days.  If you develop chest pain or shortness of breath, go to the emergency room.  Some things that can make you feel better are: - Increased rest - Increasing fluid with water/sugar free electrolytes - Acetaminophen and ibuprofen as needed for fever/pain - Salt water gargling, chloraseptic spray and throat lozenges - OTC guaifenesin (Mucinex) 600 mg twice daily for congestion - Saline sinus flushes or a neti pot - Humidifying the air -Tessalon Perles every 8 hours as needed for dry cough     ED Prescriptions     Medication Sig Dispense Auth. Provider   benzonatate (TESSALON) 100 MG capsule Take 1 capsule (100 mg total) by mouth 3 (three) times daily as needed for cough. Do not take with alcohol or while driving or operating heavy machinery.  May cause drowsiness. 21 capsule Valentino Nose, NP      PDMP not reviewed this encounter.   Valentino Nose, NP 03/21/23 1416

## 2023-06-26 ENCOUNTER — Ambulatory Visit (HOSPITAL_COMMUNITY)
Admission: EM | Admit: 2023-06-26 | Discharge: 2023-06-26 | Disposition: A | Discharge status: Home / Self Care | Attending: Family Medicine | Admitting: Family Medicine

## 2023-06-26 ENCOUNTER — Other Ambulatory Visit: Payer: Self-pay

## 2023-06-26 ENCOUNTER — Encounter (HOSPITAL_COMMUNITY): Payer: Self-pay | Admitting: *Deleted

## 2023-06-26 DIAGNOSIS — R600 Localized edema: Secondary | ICD-10-CM | POA: Insufficient documentation

## 2023-06-26 LAB — BASIC METABOLIC PANEL
Anion gap: 8 (ref 5–15)
BUN: 14 mg/dL (ref 8–23)
CO2: 28 mmol/L (ref 22–32)
Calcium: 9.3 mg/dL (ref 8.9–10.3)
Chloride: 101 mmol/L (ref 98–111)
Creatinine, Ser: 1.07 mg/dL — ABNORMAL HIGH (ref 0.44–1.00)
GFR, Estimated: 52 mL/min — ABNORMAL LOW (ref >60)
Glucose, Bld: 88 mg/dL (ref 70–99)
Potassium: 3.7 mmol/L (ref 3.5–5.1)
Sodium: 137 mmol/L (ref 135–145)

## 2023-06-26 LAB — CBC
HCT: 40.4 % (ref 36.0–46.0)
Hemoglobin: 13.2 g/dL (ref 12.0–15.0)
MCH: 28.4 pg (ref 26.0–34.0)
MCHC: 32.7 g/dL (ref 30.0–36.0)
MCV: 87.1 fL (ref 80.0–100.0)
Platelets: 254 10*3/uL (ref 150–400)
RBC: 4.64 MIL/uL (ref 3.87–5.11)
RDW: 14.9 % (ref 11.5–15.5)
WBC: 7.1 10*3/uL (ref 4.0–10.5)
nRBC: 0 % (ref 0.0–0.2)

## 2023-06-26 MED ORDER — HYDROCHLOROTHIAZIDE 25 MG PO TABS: 25.0000 mg | ORAL_TABLET | Freq: Every day | ORAL | 0 refills | Status: DC | PRN

## 2023-06-26 NOTE — Discharge Instructions (Signed)
 We have drawn blood to check your blood counts, your electrolytes and kidney function.  Our staff will notify you if anything is significantly abnormal  Hydrochlorothiazide 25 mg--1 tablet daily in the mornings as needed for swelling.  Please follow-up with your primary care about this issue

## 2023-06-26 NOTE — ED Triage Notes (Signed)
 PT reports having a spinal injection on Tuesday and has developed swelling to Lt ankle and foot. Rt foot has some swelling but not as much.

## 2023-06-26 NOTE — ED Provider Notes (Addendum)
 [facility]    CSN: [id number] Arrival date & time: 06/26/23  1750      History   Chief Complaint Chief Complaint  Patient presents with   Foot Swelling    HPI Darlene Curtis is a 83 y.o. female.   HPI Here for swelling in both feet.  It began on March 12, after she had had an epidural spinal injection on March 11.  No fever and no shortness of breath or chest pain.  The left ankle and foot are swelling a little bit more than the right foot.  This was her third epidural.  She states that after the other 2 she has had just a little bit of edema in the feet that resolved without any therapy.  It is much more pronounced at this time.  She is allergic to amoxicillin.  Past medical history includes hypertension and sciatica. Past Medical History:  Diagnosis Date   Anxiety    GERD (gastroesophageal reflux disease)    Hypertension    Sciatica     Patient Active Problem List   Diagnosis Date Noted   Partial small bowel obstruction (HCC) 11/12/2012   Hyperkalemia 11/12/2012    Past Surgical History:  Procedure Laterality Date   R shoulder surgery     TUBAL LIGATION     27 years ago    OB History   No obstetric history on file.      Home Medications    Prior to Admission medications   Medication Sig Start Date End Date Taking? Authorizing Provider  hydrochlorothiazide (HYDRODIURIL) 25 MG tablet Take 1 tablet (25 mg total) by mouth daily as needed (swelling). 06/26/23  Yes Zenia Resides, MD  hydrOXYzine (ATARAX) 10 MG tablet Take 1 tablet (10 mg total) by mouth every 6 (six) hours as needed. 09/10/22  Yes Rinaldo Ratel, [place] N, FNP  Multiple Vitamins-Minerals (CENTRUM SILVER 50+WOMEN) TABS Take 1 tablet by mouth daily with breakfast.   Yes [provider]  rosuvastatin (CRESTOR) 20 MG tablet Take 20 mg by mouth daily. 10/18/22  Yes [provider]  traMADol (ULTRAM) 50 MG tablet Take 1 tablet (50 mg total) by mouth every 6 (six)  hours as needed. 12/12/22  Yes White, Elizabeth A, PA-C  acetaminophen (TYLENOL) 500 MG tablet Take 500 mg by mouth every 6 (six) hours as needed for mild pain or moderate pain.    [provider]  amLODipine (NORVASC) 2.5 MG tablet Take 2.5 mg by mouth daily as needed (high blood pressure).  03/09/20   [provider]    Family History Family History  Problem Relation Age of Onset   Depression Son    Alcohol abuse Son     Social History Social History   Tobacco Use   Smoking status: Former   Smokeless tobacco: Never  Vaping Use   Vaping status: Never Used  Substance Use Topics   Alcohol use: No   Drug use: No     Allergies   Amoxicillin   Review of Systems Review of Systems   Physical Exam Triage Vital Signs ED Triage Vitals  Encounter Vitals Group     BP 06/26/23 1841 138/80     Systolic BP Percentile --      Diastolic BP Percentile --      Pulse Rate 06/26/23 1841 82     Resp 06/26/23 1841 10     Temp 06/26/23 1841 98.1 F (36.7 C)     Temp src --  SpO2 06/26/23 1841 100 %     Weight --      Height --      Head Circumference --      Peak Flow --      Pain Score 06/26/23 1843 7     Pain Loc --      Pain Education --      Exclude from Growth Chart --    No data found.  Updated Vital Signs BP 138/80   Pulse 82   Temp 98.1 F (36.7 C)   Resp 10   SpO2 100%   Visual Acuity Right Eye Distance:   Left Eye Distance:   Bilateral Distance:    Right Eye Near:   Left Eye Near:    Bilateral Near:     Physical Exam Vitals reviewed.  Constitutional:      General: She is not in acute distress.    Appearance: She is not ill-appearing, toxic-appearing or diaphoretic.  Cardiovascular:     Rate and Rhythm: Normal rate and regular rhythm.  Pulmonary:     Effort: Pulmonary effort is normal. No respiratory distress.     Breath sounds: Normal breath sounds. No stridor. No wheezing, rhonchi or rales.  Musculoskeletal:     Comments:  There is trace edema in the dorsum of the right foot and 1+ edema in the dorsum of the left foot.  Pulses are normal in the DP and posterior tibialis areas bilaterally.  There is no ulceration.  There is no induration of the skin.  Skin:    Coloration: Skin is not jaundiced or pale.  Neurological:     General: No focal deficit present.     Mental Status: She is alert and oriented to person, place, and time.  Psychiatric:        Behavior: Behavior normal.      UC Treatments / Results  Labs (all labs ordered are listed, but only abnormal results are displayed) Labs Reviewed  BASIC METABOLIC PANEL  CBC    EKG   Radiology No results found.  Procedures Procedures (including critical care time)  Medications Ordered in UC Medications - No data to display  Initial Impression / Assessment and Plan / UC Course  I have reviewed the triage vital signs and the nursing notes.  Pertinent labs & imaging results that were available during my care of the patient were reviewed by me and considered in my medical decision making (see chart for details).     CBC and BMP are drawn today.  Staff will notify her if anything is significantly abnormal.  Hydrochlorothiazide is sent in to take 1 daily for 3 days.  I have asked her to follow-up with her primary care about this issue Final Clinical Impressions(s) / UC Diagnoses   Final diagnoses:  Pedal edema     Discharge Instructions      We have drawn blood to check your blood counts, your electrolytes and kidney function.  Our staff will notify you if anything is significantly abnormal  Hydrochlorothiazide 25 mg--1 tablet daily in the mornings as needed for swelling.  Please follow-up with your primary care about this issue     ED Prescriptions     Medication Sig Dispense Auth. Provider   hydrochlorothiazide (HYDRODIURIL) 25 MG tablet Take 1 tablet (25 mg total) by mouth daily as needed (swelling). 3 tablet Bing Duffey, Janace Aris, MD       I have reviewed the PDMP during this encounter.   Loreta Ave  K, MD 06/26/23 Kristopher Oppenheim    Zenia Resides, MD 06/26/23 [phone]

## 2023-07-10 ENCOUNTER — Ambulatory Visit (HOSPITAL_COMMUNITY)
Admission: EM | Admit: 2023-07-10 | Discharge: 2023-07-10 | Disposition: A | Discharge status: Home / Self Care | Attending: Emergency Medicine | Admitting: Emergency Medicine

## 2023-07-10 ENCOUNTER — Encounter (HOSPITAL_COMMUNITY): Payer: Self-pay

## 2023-07-10 DIAGNOSIS — M5416 Radiculopathy, lumbar region: Secondary | ICD-10-CM | POA: Diagnosis not present

## 2023-07-10 MED ORDER — TRAMADOL HCL 50 MG PO TABS: 50.0000 mg | ORAL_TABLET | Freq: Four times a day (QID) | ORAL | 0 refills | Status: AC | PRN

## 2023-07-10 NOTE — ED Provider Notes (Signed)
 [facility]    CSN: [id number] Arrival date & time: 07/10/23  1907      History   Chief Complaint Chief Complaint  Patient presents with   Leg Pain   Hip Pain    HPI Darlene Curtis is a 83 y.o. female.   Patient presents to clinic over concerns of left-sided low back pain that radiates down the back of her left leg.  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  Recently got a spinal epidural injection on 06/23/23.  Lasted for a week or 2 but earlier this week she started to have left-sided low back pain again.  She does take tramadol daily, around 1 to 1.5 tablets daily for her pain.  She is not able to schedule a follow-up appointment with her primary care provider until Monday and is unable to tolerate this pain through the weekend.  Reports the tramadol helps her to be able to do some of her exercises and become more mobile.  She is not really a great surgery candidate per neurosurgery notes.  The history is provided by the patient and medical records.  Leg Pain Hip Pain    Past Medical History:  Diagnosis Date   Anxiety    GERD (gastroesophageal reflux disease)    Hypertension    Sciatica     Patient Active Problem List   Diagnosis Date Noted   Partial small bowel obstruction (HCC) 11/12/2012   Hyperkalemia 11/12/2012    Past Surgical History:  Procedure Laterality Date   R shoulder surgery     TUBAL LIGATION     27 years ago    OB History   No obstetric history on file.      Home Medications    Prior to Admission medications   Medication Sig Start Date End Date Taking? Authorizing Provider  traMADol (ULTRAM) 50 MG tablet Take 1 tablet (50 mg total) by mouth every 6 (six) hours as needed for up to 3 days. 07/10/23 07/13/23 Yes Rinaldo Ratel, [place] N, FNP  acetaminophen (TYLENOL) 500 MG tablet Take 500 mg by mouth every 6 (six) hours as needed for mild pain or moderate pain.    [provider]   amLODipine (NORVASC) 2.5 MG tablet Take 2.5 mg by mouth daily as needed (high blood pressure).  03/09/20   [provider]  hydrochlorothiazide (HYDRODIURIL) 25 MG tablet Take 1 tablet (25 mg total) by mouth daily as needed (swelling). 06/26/23   Zenia Resides, MD  hydrOXYzine (ATARAX) 10 MG tablet Take 1 tablet (10 mg total) by mouth every 6 (six) hours as needed. 09/10/22   Beckey Polkowski, [place] N, FNP  Multiple Vitamins-Minerals (CENTRUM SILVER 50+WOMEN) TABS Take 1 tablet by mouth daily with breakfast.    [provider]  rosuvastatin (CRESTOR) 20 MG tablet Take 20 mg by mouth daily. 10/18/22   [provider]  traMADol (ULTRAM) 50 MG tablet Take 1 tablet (50 mg total) by mouth every 6 (six) hours as needed. 12/12/22   Landis Martins, PA-C    Family History Family History  Problem Relation Age of Onset   Depression Son    Alcohol abuse Son     Social History Social History   Tobacco Use   Smoking status: Former   Smokeless tobacco: Never  Advertising account planner   Vaping status: Never Used  Substance Use Topics   Alcohol use: No   Drug use: No     Allergies   Amoxicillin  Review of Systems Review of Systems  Per HPI  Physical Exam Triage Vital Signs ED Triage Vitals  Encounter Vitals Group     BP 07/10/23 1923 (!) 155/85     Systolic BP Percentile --      Diastolic BP Percentile --      Pulse Rate 07/10/23 1923 80     Resp 07/10/23 1923 14     Temp 07/10/23 1923 98.6 F (37 C)     Temp Source 07/10/23 1923 Oral     SpO2 07/10/23 1923 98 %     Weight --      Height --      Head Circumference --      Peak Flow --      Pain Score 07/10/23 1922 9     Pain Loc --      Pain Education --      Exclude from Growth Chart --    No data found.  Updated Vital Signs BP (!) 155/85 (BP Location: Right Arm)   Pulse 80   Temp 98.6 F (37 C) (Oral)   Resp 14   SpO2 98%   Visual Acuity Right Eye Distance:   Left Eye Distance:   Bilateral  Distance:    Right Eye Near:   Left Eye Near:    Bilateral Near:     Physical Exam Vitals and nursing note reviewed.  Constitutional:      Appearance: Normal appearance.  HENT:     Head: Normocephalic and atraumatic.     Right Ear: External ear normal.     Left Ear: External ear normal.     Nose: Nose normal.     Mouth/Throat:     Mouth: Mucous membranes are moist.  Eyes:     Conjunctiva/sclera: Conjunctivae normal.  Cardiovascular:     Rate and Rhythm: Normal rate.  Pulmonary:     Effort: Pulmonary effort is normal. No respiratory distress.  Musculoskeletal:        General: Normal range of motion.  Neurological:     General: No focal deficit present.     Mental Status: She is alert.  Psychiatric:        Mood and Affect: Mood normal.      UC Treatments / Results  Labs (all labs ordered are listed, but only abnormal results are displayed) Labs Reviewed - No data to display  EKG   Radiology No results found.  Procedures Procedures (including critical care time)  Medications Ordered in UC Medications - No data to display  Initial Impression / Assessment and Plan / UC Course  I have reviewed the triage vital signs and the nursing notes.  Pertinent labs & imaging results that were available during my care of the patient were reviewed by me and considered in my medical decision making (see chart for details).  Vitals and triage reviewed, patient is hemodynamically stable.  Ambulatory in clinic.  Chronic low back pain that is present despite spinal injection earlier this month.  Will trial increasing tramadol, as this has helped in the past and encouraged PCP follow-up on Monday as scheduled.  Without red flag symptoms of inner leg numbness, incontinence or trauma.  Strict emergency precautions given if symptoms evolve.  Plan of care, follow-up care return precautions given, no questions at this time.    Final Clinical Impressions(s) / UC Diagnoses   Final  diagnoses:  Lumbar radiculopathy, chronic     Discharge Instructions      Follow-up with  your primary care provider as scheduled on Monday.  You can take the tramadol every 6 hours as needed for your back pain.  I suggest following up with neurosurgery for discussion of other viable options for your chronic back pain.  Seek immediate care if you develop any severe back pain, incontinence, trouble walking, or new concerning symptoms.    ED Prescriptions     Medication Sig Dispense Auth. Provider   traMADol (ULTRAM) 50 MG tablet Take 1 tablet (50 mg total) by mouth every 6 (six) hours as needed for up to 3 days. 5 tablet Euell Schiff, [place] N, [place]      I have reviewed the PDMP during this encounter.   Larie Mathes, [place] N, [place] 07/10/23 1949

## 2023-07-10 NOTE — Discharge Instructions (Addendum)
 Follow-up with your primary care provider as scheduled on Monday.  You can take the tramadol every 6 hours as needed for your back pain.  I suggest following up with neurosurgery for discussion of other viable options for your chronic back pain.  Seek immediate care if you develop any severe back pain, incontinence, trouble walking, or new concerning symptoms.

## 2023-07-10 NOTE — ED Triage Notes (Signed)
 Patient reports that she has been having pain in the left buttock that radiates down to the left leg. Patient states she had an Injection on 06/23/23 and after the first week, it has gradually getting worse and hurts worse with weight bearing. Patient states she can not see her PCP for 3 days for pain medication.

## 2023-10-09 ENCOUNTER — Ambulatory Visit (HOSPITAL_COMMUNITY): Admission: EM | Admit: 2023-10-09 | Discharge: 2023-10-09 | Disposition: A | Discharge status: Home / Self Care

## 2023-10-09 ENCOUNTER — Encounter (HOSPITAL_COMMUNITY): Payer: Self-pay

## 2023-10-09 DIAGNOSIS — M5442 Lumbago with sciatica, left side: Secondary | ICD-10-CM | POA: Diagnosis not present

## 2023-10-09 DIAGNOSIS — G8929 Other chronic pain: Secondary | ICD-10-CM

## 2023-10-09 MED ORDER — PREDNISONE 10 MG PO TABS: 20.0000 mg | ORAL_TABLET | Freq: Every day | ORAL | 0 refills | Status: AC

## 2023-10-09 MED ORDER — BACLOFEN 5 MG PO TABS: 5.0000 mg | ORAL_TABLET | Freq: Every evening | ORAL | 0 refills | Status: AC

## 2023-10-09 NOTE — ED Triage Notes (Signed)
 Patient presenting with left back pain and left foot swelling onset 3 days ago. Patient has history of sciatic nerve pain in her back.  Prescriptions or OTC medications tried: Yes- Tramadol  50 mg, tylenol     with mild relief for a short amount of time.

## 2023-10-09 NOTE — ED Provider Notes (Signed)
 [facility]    CSN: [id number] Arrival date & time: 10/09/23  1546      History   Chief Complaint Chief Complaint  Patient presents with   Back Pain   Foot Swelling    HPI Darlene Curtis is a 83 y.o. female.  3 day history of left low back pain that radiates down the leg. She feels this is causing swelling in the left foot. This is a chronic issue for her.  Reports history of sciatica with similar presentation. Seen here on 07/10/2023 for same  Denies numbness/tingling or weakness No bladder or bowel dysfunction No new trauma, injury, fall, etc  Has been using tramadol  and tylenol   Follows with pain management. Tramadol  50 mg, #90 last prescribed 10 days ago on 6/17  She has spondylolisthesis and lumbar radiculopathy  Had MRI recently. Taking with specialist about options.  No diabetes history   Past Medical History:  Diagnosis Date   Anxiety    GERD (gastroesophageal reflux disease)    Hypertension    Sciatica     Patient Active Problem List   Diagnosis Date Noted   Partial small bowel obstruction (HCC) 11/12/2012   Hyperkalemia 11/12/2012    Past Surgical History:  Procedure Laterality Date   R shoulder surgery     TUBAL LIGATION     27 years ago    OB History   No obstetric history on file.      Home Medications    Prior to Admission medications   Medication Sig Start Date End Date Taking? Authorizing Provider  acetaminophen  (TYLENOL ) 500 MG tablet Take 500 mg by mouth every 6 (six) hours as needed for mild pain or moderate pain.   Yes [provider]  amLODipine (NORVASC) 5 MG tablet Take 5 mg by mouth daily.   Yes [provider]  Baclofen 5 MG TABS Take 1 tablet (5 mg total) by mouth at bedtime. 10/09/23  Yes Akeira Lahm, Asberry, PA-C  hydrOXYzine  (ATARAX ) 10 MG tablet Take 1 tablet (10 mg total) by mouth every 6 (six) hours as needed. 09/10/22  Yes Garrison, [place]  N, FNP  Multiple Vitamins-Minerals (CENTRUM  SILVER 50+WOMEN) TABS Take 1 tablet by mouth daily with breakfast.   Yes [provider]  predniSONE  (DELTASONE ) 10 MG tablet Take 2 tablets (20 mg total) by mouth daily for 5 days. 10/10/23 10/15/23 Yes Raha Tennison, Asberry, PA-C  rosuvastatin (CRESTOR) 20 MG tablet Take 20 mg by mouth daily. 10/18/22  Yes [provider]  traMADol  (ULTRAM ) 50 MG tablet Take 1 tablet (50 mg total) by mouth every 6 (six) hours as needed. 12/12/22  Yes Teresa Almarie LABOR, PA-C    Family History Family History  Problem Relation Age of Onset   Depression Son    Alcohol abuse Son     Social History Social History   Tobacco Use   Smoking status: Former   Smokeless tobacco: Never  Advertising account planner   Vaping status: Never Used  Substance Use Topics   Alcohol use: No   Drug use: No     Allergies   Amoxicillin   Review of Systems Review of Systems  Musculoskeletal:  Positive for back pain.   As per HPI  Physical Exam Triage Vital Signs ED Triage Vitals  Encounter Vitals Group     BP 10/09/23 1732 136/83     Girls Systolic BP Percentile --      Girls Diastolic BP Percentile --      Boys Systolic  BP Percentile --      Boys Diastolic BP Percentile --      Pulse Rate 10/09/23 1732 76     Resp 10/09/23 1732 18     Temp 10/09/23 1732 98.2 F (36.8 C)     Temp Source 10/09/23 1732 Oral     SpO2 10/09/23 1732 98 %     Weight 10/09/23 1732 145 lb (65.8 kg)     Height 10/09/23 1732 5' 2 (1.575 m)     Head Circumference --      Peak Flow --      Pain Score 10/09/23 1729 9     Pain Loc --      Pain Education --      Exclude from Growth Chart --    No data found.  Updated Vital Signs BP 136/83 (BP Location: Left Arm)   Pulse 76   Temp 98.2 F (36.8 C) (Oral)   Resp 18   Ht 5' 2 (1.575 m)   Wt 145 lb (65.8 kg)   SpO2 98%   BMI 26.52 kg/m   Visual Acuity Right Eye Distance:   Left Eye Distance:   Bilateral Distance:    Right Eye Near:   Left Eye Near:    Bilateral Near:      Physical Exam Vitals and nursing note reviewed.  Constitutional:      General: She is not in acute distress. HENT:     Mouth/Throat:     Mouth: Mucous membranes are moist.     Pharynx: Oropharynx is clear.   Eyes:     Extraocular Movements: Extraocular movements intact.     Conjunctiva/sclera: Conjunctivae normal.     Pupils: Pupils are equal, round, and reactive to light.    Cardiovascular:     Rate and Rhythm: Normal rate and regular rhythm.     Pulses: Normal pulses.     Heart sounds: Normal heart sounds.  Pulmonary:     Effort: Pulmonary effort is normal.     Breath sounds: Normal breath sounds.   Musculoskeletal:        General: Normal range of motion.     Cervical back: Normal range of motion. No rigidity or tenderness.     Comments: Mild swelling of left ankle. Full ROM of knee and ankle. Limited ROM at hip due to back pain. Distal sensation intact. Strong DP pulse. Cap refill < 2 seconds    Skin:    General: Skin is warm and dry.   Neurological:     General: No focal deficit present.     Mental Status: She is alert and oriented to person, place, and time.     Cranial Nerves: Cranial nerves 2-12 are intact. No cranial nerve deficit.     Sensory: Sensation is intact.     Motor: Motor function is intact. No weakness.     Coordination: Coordination is intact.     Gait: Gait is intact.     Deep Tendon Reflexes: Reflexes are normal and symmetric.     Comments: Strength 5/5. Sensation intact throughout      UC Treatments / Results  Labs (all labs ordered are listed, but only abnormal results are displayed) Labs Reviewed - No data to display  EKG   Radiology No results found.  Procedures Procedures (including critical care time)  Medications Ordered in UC Medications - No data to display  Initial Impression / Assessment and Plan / UC Course  I have reviewed the triage  vital signs and the nursing notes.  Pertinent labs & imaging results that were  available during my care of the patient were reviewed by me and considered in my medical decision making (see chart for details).  Chronic low back pain with sciatica  Acute left side sciatica flare On tramadol  followed by pain clinic  No diabetes history - have offered low dose prednisone  burst Can also try baclofen 5 mg nightly Advised contacting pain specialist and PCP ED precautions  Final Clinical Impressions(s) / UC Diagnoses   Final diagnoses:  Chronic left-sided low back pain with left-sided sciatica     Discharge Instructions      The muscle relaxer Baclofen can be taken at bedtime to reduce tension/tightness of the muscles, which may reduce inflammation of the sciatic nerve. Be cautious if this makes you drowsy  Starting tomorrow morning start the prednisone  (steroid) for pain. 1 tablet in the morning daily for 5 days  Please follow up with your pain specialist      ED Prescriptions     Medication Sig Dispense Auth. Provider   predniSONE  (DELTASONE ) 10 MG tablet Take 2 tablets (20 mg total) by mouth daily for 5 days. 10 tablet Deidrick Rainey, PA-C   Baclofen 5 MG TABS Take 1 tablet (5 mg total) by mouth at bedtime. 20 tablet Kier Smead, Asberry, PA-C      I have reviewed the PDMP during this encounter.   Jeryl Asberry, [place] 10/09/23 [id number]

## 2023-10-09 NOTE — Discharge Instructions (Signed)
 The muscle relaxer Baclofen can be taken at bedtime to reduce tension/tightness of the muscles, which may reduce inflammation of the sciatic nerve. Be cautious if this makes you drowsy  Starting tomorrow morning start the prednisone  (steroid) for pain. 1 tablet in the morning daily for 5 days  Please follow up with your pain specialist

## 2023-10-23 ENCOUNTER — Ambulatory Visit (HOSPITAL_COMMUNITY): Admission: EM | Admit: 2023-10-23 | Discharge: 2023-10-23 | Disposition: A | Discharge status: Home / Self Care

## 2023-10-23 ENCOUNTER — Encounter (HOSPITAL_COMMUNITY): Payer: Self-pay | Admitting: Emergency Medicine

## 2023-10-23 DIAGNOSIS — M5442 Lumbago with sciatica, left side: Secondary | ICD-10-CM | POA: Diagnosis not present

## 2023-10-23 DIAGNOSIS — G8929 Other chronic pain: Secondary | ICD-10-CM | POA: Diagnosis not present

## 2023-10-23 NOTE — Discharge Instructions (Signed)
  Please take steroid as previously prescribed and be sure to follow up with your primary care provider and specialist.

## 2023-10-23 NOTE — ED Provider Notes (Signed)
 [facility]    CSN: [id number] Arrival date & time: 10/23/23  1328      History   Chief Complaint Chief Complaint  Patient presents with   Leg Pain    HPI Darlene Curtis is a 83 y.o. female.   Patient presents today for evaluation of left leg pain that is been ongoing for months.  She does have known chronic low back pain with sciatica and states symptoms are the same.  She was seen for same in office a few weeks ago and was prescribed steroid burst and muscle relaxer.  She reports that she did not take all of steroid because she wanted to see how it made her feel.  She denies any new injury.  She is following up with her primary care provider and just had bone density scan.  The history is provided by the patient.  Leg Pain Associated symptoms: back pain   Associated symptoms: no fever     Past Medical History:  Diagnosis Date   Anxiety    GERD (gastroesophageal reflux disease)    Hypertension    Sciatica     Patient Active Problem List   Diagnosis Date Noted   Partial small bowel obstruction (HCC) 11/12/2012   Hyperkalemia 11/12/2012    Past Surgical History:  Procedure Laterality Date   R shoulder surgery     TUBAL LIGATION     27 years ago    OB History   No obstetric history on file.      Home Medications    Prior to Admission medications   Medication Sig Start Date End Date Taking? Authorizing Provider  acetaminophen  (TYLENOL ) 500 MG tablet Take 500 mg by mouth every 6 (six) hours as needed for mild pain or moderate pain.    [provider]  alendronate (FOSAMAX) 70 MG tablet Take 70 mg by mouth once a week.    [provider]  amLODipine (NORVASC) 5 MG tablet Take 5 mg by mouth daily.    [provider]  Baclofen  5 MG TABS Take 1 tablet (5 mg total) by mouth at bedtime. 10/09/23   Rising, Asberry, PA-C  hydrOXYzine  (ATARAX ) 10 MG tablet Take 1 tablet (10 mg total) by mouth every 6 (six) hours as needed.  09/10/22   [place], [place]  N, FNP  Multiple Vitamins-Minerals (CENTRUM SILVER 50+WOMEN) TABS Take 1 tablet by mouth daily with breakfast.    [provider]  rosuvastatin (CRESTOR) 20 MG tablet Take 20 mg by mouth daily. 10/18/22   [provider]  traMADol  (ULTRAM ) 50 MG tablet Take 1 tablet (50 mg total) by mouth every 6 (six) hours as needed. 12/12/22   Teresa Almarie LABOR, PA-C    Family History Family History  Problem Relation Age of Onset   Depression Son    Alcohol abuse Son     Social History Social History   Tobacco Use   Smoking status: Former   Smokeless tobacco: Never  Advertising account planner   Vaping status: Never Used  Substance Use Topics   Alcohol use: No   Drug use: No     Allergies   Amoxicillin   Review of Systems Review of Systems  Constitutional:  Negative for chills and fever.  Eyes:  Negative for discharge and redness.  Respiratory:  Negative for shortness of breath.   Gastrointestinal:  Negative for abdominal pain, nausea and vomiting.  Musculoskeletal:  Positive for back pain and myalgias.     Physical Exam Triage  Vital Signs ED Triage Vitals [10/23/23 1426]  Encounter Vitals Group     BP      Girls Systolic BP Percentile      Girls Diastolic BP Percentile      Boys Systolic BP Percentile      Boys Diastolic BP Percentile      Pulse      Resp      Temp      Temp src      SpO2      Weight      Height      Head Circumference      Peak Flow      Pain Score 8     Pain Loc      Pain Education      Exclude from Growth Chart    No data found.  Updated Vital Signs BP (!) 170/79 (BP Location: Left Arm)   Pulse 75   Temp (!) 97.3 F (36.3 C) (Oral)   Resp 18   SpO2 98%   Visual Acuity Right Eye Distance:   Left Eye Distance:   Bilateral Distance:    Right Eye Near:   Left Eye Near:    Bilateral Near:     Physical Exam Vitals and nursing note reviewed.  Constitutional:      General: She is not in acute distress.     Appearance: Normal appearance. She is not ill-appearing.  HENT:     Head: Normocephalic and atraumatic.  Eyes:     Conjunctiva/sclera: Conjunctivae normal.  Cardiovascular:     Rate and Rhythm: Normal rate.  Pulmonary:     Effort: Pulmonary effort is normal. No respiratory distress.  Musculoskeletal:     Comments: No tenderness noted to midline thoracic or lumbar spine, mild tenderness to left low back  Neurological:     Mental Status: She is alert.  Psychiatric:        Mood and Affect: Mood normal.        Behavior: Behavior normal.        Thought Content: Thought content normal.      UC Treatments / Results  Labs (all labs ordered are listed, but only abnormal results are displayed) Labs Reviewed - No data to display  EKG   Radiology No results found.  Procedures Procedures (including critical care time)  Medications Ordered in UC Medications - No data to display  Initial Impression / Assessment and Plan / UC Course  I have reviewed the triage vital signs and the nursing notes.  Pertinent labs & imaging results that were available during my care of the patient were reviewed by me and considered in my medical decision making (see chart for details).    Suspect continued issues with chronic left-sided sciatica.  Advised that she take steroid as previously prescribed and urged patient to follow-up with her specialist as well as her primary care doctor regarding further treatment as she is utilized urgent care capabilities fully at this time.  She expressed understanding.  Final Clinical Impressions(s) / UC Diagnoses   Final diagnoses:  Chronic left-sided low back pain with left-sided sciatica     Discharge Instructions       Please take steroid as previously prescribed and be sure to follow up with your primary care provider and specialist.        ED Prescriptions   None    PDMP not reviewed this encounter.   Billy Asberry FALCON, PA-C 10/23/23  1546

## 2023-10-23 NOTE — ED Triage Notes (Signed)
 Pt c/o left leg pain that has been going on for months. Has sciatica. Denies any falls or injury.

## 2024-02-16 ENCOUNTER — Ambulatory Visit (INDEPENDENT_AMBULATORY_CARE_PROVIDER_SITE_OTHER): Admitting: Podiatry

## 2024-02-16 ENCOUNTER — Encounter: Payer: Self-pay | Admitting: Podiatry

## 2024-02-16 VITALS — Ht 62.0 in | Wt 145.0 lb

## 2024-02-16 DIAGNOSIS — L600 Ingrowing nail: Secondary | ICD-10-CM | POA: Diagnosis not present

## 2024-02-16 MED ORDER — NEOMYCIN-POLYMYXIN-HC 3.5-10000-1 OT SUSP: OTIC | 0 refills | Status: AC

## 2024-02-16 NOTE — Patient Instructions (Signed)

## 2024-02-16 NOTE — Progress Notes (Signed)
  Subjective:  Patient ID: Darlene Curtis, female    DOB: Aug 21, 1940,  MRN: [id number]  Chief Complaint  Patient presents with   Ingrown Toenail    RM 2 New Patient Ingrown toe nail of the right hallux. Pain on the lateral side. Pain has been present for 1 month.     83 y.o. female presents with the above complaint. History confirmed with patient.   Objective:  Physical Exam: warm, good capillary refill, no trophic changes or ulcerative lesions, normal DP and PT pulses, normal sensory exam, and ingrown right hallux medial border no paronychia. Assessment:   1. Ingrowing right great toenail      Plan:  Patient was evaluated and treated and all questions answered.    Ingrown Nail, right -Patient elects to proceed with minor surgery to remove ingrown toenail today. Consent reviewed and signed by patient. -Ingrown nail excised. See procedure note. -Educated on post-procedure care including soaking. Written instructions provided and reviewed. -Rx for Cortisporin sent to pharmacy. -Advised on signs and symptoms of infection developing.  We discussed that the phenol likely will create some redness and edema and tenderness around the nailbed as long as it is localized this is to be expected.  Will return as needed if any infection signs develop  Procedure: Excision of Ingrown Toenail Location: Right 1st toe medial nail borders. Anesthesia: Lidocaine  1% plain; 1.5 mL and Marcaine 0.5% plain; 1.5 mL, digital block. Skin Prep: Betadine. Dressing: Silvadene; telfa; dry, sterile, compression dressing. Technique: Following skin prep, the toe was exsanguinated and a tourniquet was secured at the base of the toe. The affected nail border was freed, split with a nail splitter, and excised. Chemical matrixectomy was then performed with phenol and irrigated out with alcohol. The tourniquet was then removed and sterile dressing applied. Disposition: Patient tolerated procedure well.     Return if symptoms worsen or fail to improve.

## 2024-02-17 ENCOUNTER — Telehealth: Payer: Self-pay | Admitting: Podiatry

## 2024-02-17 NOTE — Telephone Encounter (Signed)
 Patient stated pharmicist does not have any orders for medication for patient received
